# Patient Record
Sex: Female | Born: 1950 | Race: White | Hispanic: No | Smoking: Former smoker
Health system: Southern US, Community
[De-identification: ages and names within clinical notes are randomized; demographics above are authoritative.]

## PROBLEM LIST (undated history)

## (undated) DIAGNOSIS — I10 Essential (primary) hypertension: Secondary | ICD-10-CM

## (undated) DIAGNOSIS — Z780 Asymptomatic menopausal state: Secondary | ICD-10-CM

## (undated) DIAGNOSIS — E785 Hyperlipidemia, unspecified: Secondary | ICD-10-CM

## (undated) DIAGNOSIS — S83209A Unspecified tear of unspecified meniscus, current injury, unspecified knee, initial encounter: Secondary | ICD-10-CM

## (undated) HISTORY — DX: Asymptomatic menopausal state: Z78.0

## (undated) HISTORY — DX: Hyperlipidemia, unspecified: E78.5

## (undated) HISTORY — PX: TONSILLECTOMY: SHX5217

## (undated) HISTORY — DX: Unspecified tear of unspecified meniscus, current injury, unspecified knee, initial encounter: S83.209A

## (undated) HISTORY — DX: Essential (primary) hypertension: I10

---

## 1976-08-23 HISTORY — PX: BREAST ENHANCEMENT SURGERY: SHX7

## 1998-06-03 ENCOUNTER — Encounter: Payer: Self-pay | Admitting: Family Medicine

## 1998-06-03 ENCOUNTER — Ambulatory Visit (HOSPITAL_COMMUNITY): Admission: RE | Admit: 1998-06-03 | Discharge: 1998-06-03 | Payer: Self-pay | Admitting: Family Medicine

## 1998-06-18 ENCOUNTER — Ambulatory Visit (HOSPITAL_COMMUNITY): Admission: RE | Admit: 1998-06-18 | Discharge: 1998-06-18 | Payer: Self-pay | Admitting: Family Medicine

## 1998-06-18 ENCOUNTER — Encounter: Payer: Self-pay | Admitting: Family Medicine

## 2003-03-04 ENCOUNTER — Other Ambulatory Visit: Admission: RE | Admit: 2003-03-04 | Discharge: 2003-03-04 | Payer: Self-pay | Admitting: Family Medicine

## 2003-03-27 LAB — HM COLONOSCOPY: HM Colonoscopy: UNDETERMINED

## 2004-07-08 ENCOUNTER — Ambulatory Visit: Payer: Self-pay | Admitting: Family Medicine

## 2004-12-14 ENCOUNTER — Ambulatory Visit: Payer: Self-pay | Admitting: Family Medicine

## 2004-12-25 ENCOUNTER — Ambulatory Visit: Payer: Self-pay | Admitting: *Deleted

## 2005-08-04 ENCOUNTER — Ambulatory Visit: Payer: Self-pay | Admitting: Family Medicine

## 2005-08-05 ENCOUNTER — Encounter: Admission: RE | Admit: 2005-08-05 | Discharge: 2005-08-05 | Payer: Self-pay | Admitting: Family Medicine

## 2005-08-18 ENCOUNTER — Ambulatory Visit: Payer: Self-pay

## 2005-08-20 ENCOUNTER — Encounter: Admission: RE | Admit: 2005-08-20 | Discharge: 2005-08-20 | Payer: Self-pay | Admitting: Family Medicine

## 2005-08-24 ENCOUNTER — Ambulatory Visit: Payer: Self-pay | Admitting: Family Medicine

## 2006-08-22 ENCOUNTER — Ambulatory Visit: Payer: Self-pay | Admitting: Family Medicine

## 2006-09-05 ENCOUNTER — Ambulatory Visit: Payer: Self-pay | Admitting: Family Medicine

## 2006-09-05 LAB — CONVERTED CEMR LAB
Albumin: 4.2 g/dL (ref 3.5–5.2)
Alkaline Phosphatase: 65 units/L (ref 39–117)
Basophils Absolute: 0.1 10*3/uL (ref 0.0–0.1)
CO2: 31 meq/L (ref 19–32)
Calcium: 9.5 mg/dL (ref 8.4–10.5)
Chol/HDL Ratio, serum: 4.4
Creatinine, Ser: 1 mg/dL (ref 0.4–1.2)
Glomerular Filtration Rate, Af Am: 74 mL/min/{1.73_m2}
Glucose, Bld: 103 mg/dL — ABNORMAL HIGH (ref 70–99)
HDL: 46.6 mg/dL (ref >39.0)
Lymphocytes Relative: 24.5 % (ref 12.0–46.0)
MCHC: 34.1 g/dL (ref 30.0–36.0)
Monocytes Absolute: 0.4 10*3/uL (ref 0.2–0.7)
Monocytes Relative: 6.1 % (ref 3.0–11.0)
Neutro Abs: 3.9 10*3/uL (ref 1.4–7.7)
Neutrophils Relative %: 67.3 % (ref 43.0–77.0)
Platelets: 274 10*3/uL (ref 150–400)
Potassium: 3.7 meq/L (ref 3.5–5.1)
TSH: 1.04 microintl units/mL (ref 0.35–5.50)
Total Bilirubin: 0.9 mg/dL (ref 0.3–1.2)
Total Protein: 7.3 g/dL (ref 6.0–8.3)
Triglyceride fasting, serum: 126 mg/dL (ref 0–149)

## 2006-11-14 ENCOUNTER — Encounter: Payer: Self-pay | Admitting: Family Medicine

## 2006-11-14 ENCOUNTER — Ambulatory Visit: Payer: Self-pay | Admitting: Family Medicine

## 2006-11-14 ENCOUNTER — Other Ambulatory Visit: Admission: RE | Admit: 2006-11-14 | Discharge: 2006-11-14 | Payer: Self-pay | Admitting: Family Medicine

## 2007-01-26 ENCOUNTER — Encounter: Payer: Self-pay | Admitting: Family Medicine

## 2007-06-01 ENCOUNTER — Telehealth (INDEPENDENT_AMBULATORY_CARE_PROVIDER_SITE_OTHER): Payer: Self-pay | Admitting: *Deleted

## 2007-06-22 ENCOUNTER — Telehealth (INDEPENDENT_AMBULATORY_CARE_PROVIDER_SITE_OTHER): Payer: Self-pay | Admitting: *Deleted

## 2007-09-27 ENCOUNTER — Ambulatory Visit: Payer: Self-pay | Admitting: Family Medicine

## 2007-09-27 DIAGNOSIS — M722 Plantar fascial fibromatosis: Secondary | ICD-10-CM | POA: Insufficient documentation

## 2007-09-27 DIAGNOSIS — I10 Essential (primary) hypertension: Secondary | ICD-10-CM | POA: Insufficient documentation

## 2007-09-27 DIAGNOSIS — J309 Allergic rhinitis, unspecified: Secondary | ICD-10-CM | POA: Insufficient documentation

## 2007-09-27 DIAGNOSIS — N951 Menopausal and female climacteric states: Secondary | ICD-10-CM | POA: Insufficient documentation

## 2007-10-17 ENCOUNTER — Ambulatory Visit: Payer: Self-pay | Admitting: Family Medicine

## 2007-10-18 ENCOUNTER — Encounter: Payer: Self-pay | Admitting: Family Medicine

## 2007-10-30 LAB — CONVERTED CEMR LAB
ALT: 21 units/L (ref 0–35)
AST: 35 units/L (ref 0–37)
Albumin: 4 g/dL (ref 3.5–5.2)
Alkaline Phosphatase: 50 units/L (ref 39–117)
BUN: 19 mg/dL (ref 6–23)
Bilirubin, Direct: 0.1 mg/dL (ref 0.0–0.3)
Calcium: 9.5 mg/dL (ref 8.4–10.5)
Chloride: 105 meq/L (ref 96–112)
Cholesterol: 197 mg/dL (ref 0–200)
GFR calc Af Amer: 74 mL/min
GFR calc non Af Amer: 61 mL/min
LDL Cholesterol: 125 mg/dL — ABNORMAL HIGH (ref 0–99)
Total CHOL/HDL Ratio: 3.6
VLDL: 17 mg/dL (ref 0–40)

## 2008-04-01 ENCOUNTER — Ambulatory Visit: Payer: Self-pay | Admitting: Family Medicine

## 2008-04-01 ENCOUNTER — Telehealth (INDEPENDENT_AMBULATORY_CARE_PROVIDER_SITE_OTHER): Payer: Self-pay | Admitting: *Deleted

## 2008-04-01 ENCOUNTER — Encounter (INDEPENDENT_AMBULATORY_CARE_PROVIDER_SITE_OTHER): Payer: Self-pay | Admitting: *Deleted

## 2008-04-01 LAB — CONVERTED CEMR LAB
ALT: 20 units/L (ref 0–35)
AST: 35 units/L (ref 0–37)
Albumin: 3.8 g/dL (ref 3.5–5.2)
Alkaline Phosphatase: 48 units/L (ref 39–117)

## 2008-05-30 ENCOUNTER — Encounter (INDEPENDENT_AMBULATORY_CARE_PROVIDER_SITE_OTHER): Payer: Self-pay | Admitting: *Deleted

## 2008-07-30 ENCOUNTER — Other Ambulatory Visit: Admission: RE | Admit: 2008-07-30 | Discharge: 2008-07-30 | Payer: Self-pay | Admitting: Family Medicine

## 2008-07-30 ENCOUNTER — Encounter: Payer: Self-pay | Admitting: Family Medicine

## 2008-07-30 ENCOUNTER — Ambulatory Visit: Payer: Self-pay | Admitting: Family Medicine

## 2008-07-30 DIAGNOSIS — Z780 Asymptomatic menopausal state: Secondary | ICD-10-CM | POA: Insufficient documentation

## 2008-07-30 LAB — CONVERTED CEMR LAB
Blood in Urine, dipstick: NEGATIVE
Glucose, Urine, Semiquant: NEGATIVE
Nitrite: NEGATIVE
Specific Gravity, Urine: 1.01
WBC Urine, dipstick: NEGATIVE
pH: 6

## 2008-07-30 LAB — HM PAP SMEAR: HM Pap smear: NEGATIVE

## 2008-07-31 ENCOUNTER — Encounter: Payer: Self-pay | Admitting: Family Medicine

## 2008-08-06 ENCOUNTER — Encounter: Payer: Self-pay | Admitting: Family Medicine

## 2008-08-06 ENCOUNTER — Encounter (INDEPENDENT_AMBULATORY_CARE_PROVIDER_SITE_OTHER): Payer: Self-pay | Admitting: *Deleted

## 2008-08-12 ENCOUNTER — Encounter (INDEPENDENT_AMBULATORY_CARE_PROVIDER_SITE_OTHER): Payer: Self-pay | Admitting: *Deleted

## 2008-08-12 LAB — CONVERTED CEMR LAB
ALT: 22 units/L (ref 0–35)
AST: 44 units/L — ABNORMAL HIGH (ref 0–37)
Albumin: 4.1 g/dL (ref 3.5–5.2)
BUN: 16 mg/dL (ref 6–23)
Basophils Relative: 2.8 % (ref 0.0–3.0)
CO2: 28 meq/L (ref 19–32)
Calcium: 9.4 mg/dL (ref 8.4–10.5)
Chloride: 105 meq/L (ref 96–112)
Creatinine, Ser: 1 mg/dL (ref 0.4–1.2)
Direct LDL: 130.2 mg/dL
Eosinophils Relative: 3.5 % (ref 0.0–5.0)
Glucose, Bld: 55 mg/dL — ABNORMAL LOW (ref 70–99)
HDL: 50.3 mg/dL (ref >39.0)
Hemoglobin: 13.3 g/dL (ref 12.0–15.0)
Lymphocytes Relative: 40.9 % (ref 12.0–46.0)
MCHC: 33.6 g/dL (ref 30.0–36.0)
MCV: 85.3 fL (ref 78.0–100.0)
Neutro Abs: 1.6 10*3/uL (ref 1.4–7.7)
Neutrophils Relative %: 31 % — ABNORMAL LOW (ref 43.0–77.0)
RBC: 4.63 M/uL (ref 3.87–5.11)
TSH: 1.59 microintl units/mL (ref 0.35–5.50)
Total Protein: 7.2 g/dL (ref 6.0–8.3)
VLDL: 28 mg/dL (ref 0–40)
WBC: 5.2 10*3/uL (ref 4.5–10.5)

## 2008-09-02 ENCOUNTER — Encounter (INDEPENDENT_AMBULATORY_CARE_PROVIDER_SITE_OTHER): Payer: Self-pay | Admitting: *Deleted

## 2008-09-02 ENCOUNTER — Ambulatory Visit: Payer: Self-pay | Admitting: Family Medicine

## 2008-09-02 LAB — CONVERTED CEMR LAB: OCCULT 3: NEGATIVE

## 2008-09-03 ENCOUNTER — Encounter: Payer: Self-pay | Admitting: Family Medicine

## 2008-09-13 ENCOUNTER — Encounter (INDEPENDENT_AMBULATORY_CARE_PROVIDER_SITE_OTHER): Payer: Self-pay | Admitting: *Deleted

## 2008-11-01 ENCOUNTER — Ambulatory Visit: Payer: Self-pay | Admitting: Family Medicine

## 2008-11-01 DIAGNOSIS — R059 Cough, unspecified: Secondary | ICD-10-CM | POA: Insufficient documentation

## 2008-11-01 DIAGNOSIS — R05 Cough: Secondary | ICD-10-CM

## 2008-11-27 ENCOUNTER — Ambulatory Visit: Payer: Self-pay | Admitting: Family Medicine

## 2008-12-02 ENCOUNTER — Telehealth (INDEPENDENT_AMBULATORY_CARE_PROVIDER_SITE_OTHER): Payer: Self-pay | Admitting: *Deleted

## 2009-03-13 ENCOUNTER — Ambulatory Visit: Payer: Self-pay | Admitting: Family Medicine

## 2009-04-29 ENCOUNTER — Encounter (INDEPENDENT_AMBULATORY_CARE_PROVIDER_SITE_OTHER): Payer: Self-pay | Admitting: *Deleted

## 2009-06-09 ENCOUNTER — Ambulatory Visit: Payer: Self-pay | Admitting: Family Medicine

## 2009-06-09 DIAGNOSIS — M79609 Pain in unspecified limb: Secondary | ICD-10-CM | POA: Insufficient documentation

## 2009-07-09 ENCOUNTER — Telehealth: Payer: Self-pay | Admitting: Family Medicine

## 2009-07-24 ENCOUNTER — Ambulatory Visit: Payer: Self-pay | Admitting: Family Medicine

## 2009-07-28 LAB — CONVERTED CEMR LAB
AST: 38 units/L — ABNORMAL HIGH (ref 0–37)
BUN: 13 mg/dL (ref 6–23)
Bilirubin, Direct: 0 mg/dL (ref 0.0–0.3)
Direct LDL: 157.6 mg/dL
GFR calc non Af Amer: 68.32 mL/min (ref >60)
HDL: 45 mg/dL (ref >39.00)
Potassium: 3.8 meq/L (ref 3.5–5.1)
Sodium: 141 meq/L (ref 135–145)
Total Bilirubin: 0.9 mg/dL (ref 0.3–1.2)
Total CHOL/HDL Ratio: 5
VLDL: 23.6 mg/dL (ref 0.0–40.0)

## 2009-09-09 ENCOUNTER — Ambulatory Visit: Payer: Self-pay | Admitting: Family Medicine

## 2009-09-09 DIAGNOSIS — E785 Hyperlipidemia, unspecified: Secondary | ICD-10-CM | POA: Insufficient documentation

## 2009-10-01 ENCOUNTER — Ambulatory Visit: Payer: Self-pay | Admitting: Family Medicine

## 2009-11-20 ENCOUNTER — Ambulatory Visit: Payer: Self-pay | Admitting: Family Medicine

## 2009-11-20 DIAGNOSIS — J019 Acute sinusitis, unspecified: Secondary | ICD-10-CM | POA: Insufficient documentation

## 2009-12-01 ENCOUNTER — Telehealth (INDEPENDENT_AMBULATORY_CARE_PROVIDER_SITE_OTHER): Payer: Self-pay | Admitting: *Deleted

## 2009-12-09 ENCOUNTER — Ambulatory Visit: Payer: Self-pay | Admitting: Family Medicine

## 2009-12-09 LAB — CONVERTED CEMR LAB
ALT: 29 units/L (ref 0–35)
AST: 43 units/L — ABNORMAL HIGH (ref 0–37)
Albumin: 4.1 g/dL (ref 3.5–5.2)
Alkaline Phosphatase: 56 units/L (ref 39–117)

## 2009-12-23 ENCOUNTER — Telehealth (INDEPENDENT_AMBULATORY_CARE_PROVIDER_SITE_OTHER): Payer: Self-pay | Admitting: *Deleted

## 2009-12-31 ENCOUNTER — Telehealth (INDEPENDENT_AMBULATORY_CARE_PROVIDER_SITE_OTHER): Payer: Self-pay | Admitting: *Deleted

## 2010-01-20 ENCOUNTER — Telehealth: Payer: Self-pay | Admitting: Family Medicine

## 2010-02-26 ENCOUNTER — Ambulatory Visit: Payer: Self-pay | Admitting: Family Medicine

## 2010-03-13 ENCOUNTER — Ambulatory Visit: Payer: Self-pay | Admitting: Family Medicine

## 2010-08-26 ENCOUNTER — Other Ambulatory Visit: Payer: Self-pay | Admitting: Family Medicine

## 2010-08-26 ENCOUNTER — Telehealth (INDEPENDENT_AMBULATORY_CARE_PROVIDER_SITE_OTHER): Payer: Self-pay | Admitting: *Deleted

## 2010-08-26 ENCOUNTER — Ambulatory Visit
Admission: RE | Admit: 2010-08-26 | Discharge: 2010-08-26 | Payer: Self-pay | Source: Home / Self Care | Attending: Family Medicine | Admitting: Family Medicine

## 2010-08-26 LAB — HEPATIC FUNCTION PANEL
ALT: 38 U/L — ABNORMAL HIGH (ref 0–35)
AST: 51 U/L — ABNORMAL HIGH (ref 0–37)
Albumin: 4 g/dL (ref 3.5–5.2)
Alkaline Phosphatase: 55 U/L (ref 39–117)
Bilirubin, Direct: 0.1 mg/dL (ref 0.0–0.3)
Total Bilirubin: 0.9 mg/dL (ref 0.3–1.2)
Total Protein: 7 g/dL (ref 6.0–8.3)

## 2010-08-26 LAB — LIPID PANEL
Cholesterol: 156 mg/dL (ref 0–200)
HDL: 52.7 mg/dL (ref >39.00)
LDL Cholesterol: 84 mg/dL (ref 0–99)
Total CHOL/HDL Ratio: 3
Triglycerides: 98 mg/dL (ref 0.0–149.0)
VLDL: 19.6 mg/dL (ref 0.0–40.0)

## 2010-08-26 LAB — BASIC METABOLIC PANEL
BUN: 17 mg/dL (ref 6–23)
CO2: 29 mEq/L (ref 19–32)
Calcium: 9.1 mg/dL (ref 8.4–10.5)
Chloride: 104 mEq/L (ref 96–112)
Creatinine, Ser: 0.8 mg/dL (ref 0.4–1.2)
GFR: 82.73 mL/min (ref >60.00)
Glucose, Bld: 92 mg/dL (ref 70–99)
Potassium: 3.9 mEq/L (ref 3.5–5.1)
Sodium: 139 mEq/L (ref 135–145)

## 2010-09-09 ENCOUNTER — Ambulatory Visit
Admission: RE | Admit: 2010-09-09 | Discharge: 2010-09-09 | Payer: Self-pay | Source: Home / Self Care | Attending: Family Medicine | Admitting: Family Medicine

## 2010-09-09 ENCOUNTER — Other Ambulatory Visit: Payer: Self-pay | Admitting: Family Medicine

## 2010-09-09 ENCOUNTER — Encounter: Payer: Self-pay | Admitting: Family Medicine

## 2010-09-09 LAB — HEPATIC FUNCTION PANEL
ALT: 31 U/L (ref 0–35)
AST: 46 U/L — ABNORMAL HIGH (ref 0–37)
Albumin: 4.1 g/dL (ref 3.5–5.2)
Alkaline Phosphatase: 60 U/L (ref 39–117)
Bilirubin, Direct: 0.1 mg/dL (ref 0.0–0.3)
Total Bilirubin: 0.8 mg/dL (ref 0.3–1.2)
Total Protein: 6.8 g/dL (ref 6.0–8.3)

## 2010-09-09 LAB — GAMMA GT: GGT: 18 U/L (ref 7–51)

## 2010-09-11 LAB — CONVERTED CEMR LAB
Hep B C IgM: NEGATIVE
Hepatitis B Surface Ag: NEGATIVE

## 2010-09-14 ENCOUNTER — Encounter: Payer: Self-pay | Admitting: Family Medicine

## 2010-09-14 ENCOUNTER — Telehealth: Payer: Self-pay | Admitting: Family Medicine

## 2010-09-21 ENCOUNTER — Encounter: Payer: Self-pay | Admitting: Family Medicine

## 2010-09-22 NOTE — Assessment & Plan Note (Signed)
Summary: rto 2 weeks.cbs   Vital Signs:  Patient profile:   60 year old female Height:      64 inches Weight:      187 pounds Temp:     98.4 degrees F oral Pulse rate:   72 / minute BP sitting:   140 / 100  (left arm)  Vitals Entered By: Jeremy Johann CMA (March 13, 2010 9:47 AM)  Serial Vital Signs/Assessments:  Time      Position  BP       Pulse  Resp  Temp     By 10:12 AM            120/86                         Loreen Freud DO  CC: 2 week f/u BP    History of Present Illness: pt here for bp check since changing med.  No complaints.  Current Medications (verified): 1)  Ziac 5-6.25 Mg Tabs (Bisoprolol-Hydrochlorothiazide) .Marland Kitchen.. 1 By Mouth Once Daily 2)  Nasonex 50 Mcg/act Susp (Mometasone Furoate) .... 2 Sprays Each Nostril Once Daily 3)  Lipitor 10 Mg Tabs (Atorvastatin Calcium) .Marland Kitchen.. 1 By Mouth At Bedtime 4)  Zyrtec Allergy 10 Mg Tabs (Cetirizine Hcl) .... Take 1 Tab As Needed  Allergies (verified): 1)  ! Sulfa  Past History:  Past medical, surgical, family and social histories (including risk factors) reviewed for relevance to current acute and chronic problems.  Past Medical History: Reviewed history from 09/09/2009 and no changes required. Hypertension Allergic rhinitis Hyperlipidemia  Past Surgical History: Reviewed history from 09/27/2007 and no changes required. Tonsillectomy Breast implants-1997  Family History: Reviewed history from 07/30/2008 and no changes required. Family History Diabetes 1st degree relative Family History Hypertension Family History Ovarian cancer MGM--cva  Social History: Reviewed history from 07/30/2008 and no changes required. Occupation: Advice worker Married Never Smoked Alcohol use-yes Drug use-no Regular exercise-yes  Review of Systems      See HPI  Physical Exam  General:  Well-developed,well-nourished,in no acute distress; alert,appropriate and cooperative throughout examination Lungs:  Normal  respiratory effort, chest expands symmetrically. Lungs are clear to auscultation, no crackles or wheezes. Heart:  normal rate and no murmur.   Extremities:  No clubbing, cyanosis, edema, or deformity noted with normal full range of motion of all joints.   Psych:  Cognition and judgment appear intact. Alert and cooperative with normal attention span and concentration. No apparent delusions, illusions, hallucinations   Impression & Recommendations:  Problem # 1:  HYPERTENSION (ICD-401.9)  Her updated medication list for this problem includes:    Ziac 5-6.25 Mg Tabs (Bisoprolol-hydrochlorothiazide) .Marland Kitchen... 1 by mouth once daily  BP today: 140/100 Prior BP: 130/78 (02/26/2010)  Prior 10 Yr Risk Heart Disease: Not enough information (09/27/2007)  Labs Reviewed: K+: 3.8 (07/24/2009) Creat: : 0.9 (07/24/2009)   Chol: 209 (07/24/2009)   HDL: 45.00 (07/24/2009)   LDL: DEL (07/30/2008)   TG: 118.0 (07/24/2009)  Problem # 2:  HYPERLIPIDEMIA (ICD-272.4)  Her updated medication list for this problem includes:    Lipitor 10 Mg Tabs (Atorvastatin calcium) .Marland Kitchen... 1 by mouth at bedtime  Labs Reviewed: SGOT: 43 (12/09/2009)   SGPT: 29 (12/09/2009)  Prior 10 Yr Risk Heart Disease: Not enough information (09/27/2007)   HDL:45.00 (07/24/2009), 50.3 (07/30/2008)  LDL:DEL (07/30/2008), 125 (10/17/2007)  Chol:209 (07/24/2009), 205 (07/30/2008)  Trig:118.0 (07/24/2009), 140 (07/30/2008)  Complete Medication List: 1)  Ziac 5-6.25 Mg Tabs (Bisoprolol-hydrochlorothiazide) .Marland KitchenMarland KitchenMarland Kitchen  1 by mouth once daily 2)  Nasonex 50 Mcg/act Susp (Mometasone furoate) .... 2 sprays each nostril once daily 3)  Lipitor 10 Mg Tabs (Atorvastatin calcium) .Marland Kitchen.. 1 by mouth at bedtime 4)  Zyrtec Allergy 10 Mg Tabs (Cetirizine hcl) .... Take 1 tab as needed  Patient Instructions: 1)  f/u October for labs and bp Prescriptions: ZIAC 5-6.25 MG TABS (BISOPROLOL-HYDROCHLOROTHIAZIDE) 1 by mouth once daily  #90 x 3   Entered and Authorized  by:   Loreen Freud DO   Signed by:   Loreen Freud DO on 03/13/2010   Method used:   Electronically to        [id number] (retail)       [address]       [place], [place]  [address]       Ph: [phone]       Fax: [phone]   RxID:   [id number]

## 2010-09-22 NOTE — Assessment & Plan Note (Signed)
Summary: COUGH,FLUID IN EARS/KDC   Vital Signs:  Patient profile:   60 year old female Weight:      182 pounds Temp:     98.3 degrees F oral Pulse rate:   78 / minute Pulse rhythm:   regular BP sitting:   140 / 82  (left arm) Cuff size:   large  Vitals Entered By: Army Fossa CMA (November 20, 2009 12:52 PM) CC: Pt here c/o chest congestion, feels like she has fluid in her ears. x 1 week., URI symptoms   History of Present Illness:       This is a 60 year old woman who presents with URI symptoms.  The symptoms began 1 week ago.  The patient complains of nasal congestion, purulent nasal discharge, productive cough, and earache, but denies sick contacts.  The patient denies fever, low-grade fever (<100.5 degrees), fever of 100.5-103 degrees, fever of 103.1-104 degrees, fever to >104 degrees, stiff neck, dyspnea, wheezing, rash, vomiting, diarrhea, use of an antipyretic, and response to antipyretic.  The patient also reports headache.  The patient denies itchy watery eyes, itchy throat, sneezing, seasonal symptoms, response to antihistamine, muscle aches, and severe fatigue.  The patient denies the following risk factors for Strep sinusitis: unilateral facial pain, unilateral nasal discharge, poor response to decongestant, double sickening, tooth pain, Strep exposure, tender adenopathy, and absence of cough.  B/L sinus pressure.  Pt has nasonex but is not using it.  Current Medications (verified): 1)  Norvasc 10 Mg Tabs (Amlodipine Besylate) .Marland Kitchen.. 1 By Mouth Once Daily 2)  Nasonex 50 Mcg/act Susp (Mometasone Furoate) .... 2 Sprays Each Nostril Once Daily 3)  Clonazepam 0.5 Mg Tabs (Clonazepam) .... Take 1/2 Day As Needed 4)  Augmentin 875-125 Mg Tabs (Amoxicillin-Pot Clavulanate) .Marland Kitchen.. 1 By Mouth Two Times A Day  Allergies: 1)  ! Sulfa  Past History:  Past medical, surgical, family and social histories (including risk factors) reviewed for relevance to current acute and chronic  problems.  Past Medical History: Reviewed history from 09/09/2009 and no changes required. Hypertension Allergic rhinitis Hyperlipidemia  Past Surgical History: Reviewed history from 09/27/2007 and no changes required. Tonsillectomy Breast implants-1997  Family History: Reviewed history from 07/30/2008 and no changes required. Family History Diabetes 1st degree relative Family History Hypertension Family History Ovarian cancer MGM--cva  Social History: Reviewed history from 07/30/2008 and no changes required. 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   Nose:  no external deformity, L frontal sinus tenderness, L maxillary sinus tenderness, R frontal sinus tenderness, and R maxillary sinus tenderness.   Mouth:  Oral mucosa and oropharynx without lesions or exudates.  Teeth in good repair. Neck:  No deformities, masses, or tenderness noted. Lungs:  Normal respiratory effort, chest expands symmetrically. Lungs are clear to auscultation, no crackles or wheezes. Heart:  normal rate and no murmur.   Skin:  Intact without suspicious lesions or rashes Cervical Nodes:  No lymphadenopathy noted Psych:  Cognition and judgment appear intact. Alert and cooperative with normal attention span and concentration. No apparent delusions, illusions, hallucinations   Impression & Recommendations:  Problem # 1:  SINUSITIS - ACUTE-NOS (ICD-461.9)  Her updated medication list for this problem includes:    Nasonex 50 Mcg/act Susp (Mometasone furoate) .Marland KitchenMarland KitchenMarland KitchenMarland Kitchen 2  sprays each nostril once daily    Augmentin 875-125 Mg Tabs (Amoxicillin-pot clavulanate) .Marland Kitchen... 1 by mouth two times a day  Instructed on treatment. Call if  symptoms persist or worsen.   Complete Medication List: 1)  Norvasc 10 Mg Tabs (Amlodipine besylate) .Marland Kitchen.. 1 by mouth once daily 2)  Nasonex 50 Mcg/act Susp (Mometasone furoate) .... 2 sprays each nostril once daily 3)  Clonazepam 0.5 Mg Tabs (Clonazepam) .... Take 1/2 day as needed 4)  Augmentin 875-125 Mg Tabs (Amoxicillin-pot clavulanate) .Marland Kitchen.. 1 by mouth two times a day Prescriptions: AUGMENTIN 875-125 MG TABS (AMOXICILLIN-POT CLAVULANATE) 1 by mouth two times a day  #20 x 0   Entered and Authorized by:   Loreen Freud DO   Signed by:   Loreen Freud DO on 11/20/2009   Method used:   Electronically to        [id number] (retail)       [address]       [place], [place]  [address]       Ph: [phone]       Fax: [phone]   RxID:   [id number]

## 2010-09-22 NOTE — Assessment & Plan Note (Signed)
Summary: rto 3  months/cbs   Vital Signs:  Patient profile:   60 year old female Height:      64 inches Weight:      184 pounds Temp:     98.2 degrees F oral Pulse rate:   66 / minute BP sitting:   130 / 78  (left arm)  Vitals Entered By: Jeremy Johann CMA (February 26, 2010 9:35 AM) CC: 3 month f/u Comments REVIEWED MED LIST, PATIENT AGREED DOSE AND INSTRUCTION CORRECT    History of Present Illness:  Hypertension follow-up      This is a 60 year old woman who presents for Hypertension follow-up.  Pt only complaint is that exforge is too expensive.  The patient denies lightheadedness, urinary frequency, headaches, edema, impotence, rash, and fatigue.  The patient denies the following associated symptoms: chest pain, chest pressure, exercise intolerance, dyspnea, palpitations, syncope, leg edema, and pedal edema.  Compliance with medications (by patient report) has been near 100%.  The patient reports that dietary compliance has been good.  The patient reports exercising daily.  Adjunctive measures currently used by the patient include salt restriction.    Hyperlipidemia follow-up      The patient also presents for Hyperlipidemia follow-up.  The patient complains of fatigue, but denies muscle aches, GI upset, abdominal pain, flushing, itching, constipation, and diarrhea.  The patient denies the following symptoms: chest pain/pressure, exercise intolerance, dypsnea, palpitations, syncope, and pedal edema.  Compliance with medications (by patient report) has been near 100%.  Dietary compliance has been good.  The patient reports exercising daily.    Allergies: 1)  ! Sulfa  Past History:  Past medical, surgical, family and social histories (including risk factors) reviewed for relevance to current acute and chronic problems.  Past Medical History: Reviewed history from 09/09/2009 and no changes required. Hypertension Allergic rhinitis Hyperlipidemia  Past Surgical History: Reviewed  history from 09/27/2007 and no changes required. Tonsillectomy Breast implants-1997  Family History: Reviewed history from 07/30/2008 and no changes required. Family History Diabetes 1st degree relative Family History Hypertension Family History Ovarian cancer MGM--cva  Social History: Reviewed history from 07/30/2008 and no changes required. 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 Lungs are clear to auscultation, no crackles or wheezes. Heart:  normal rate and no murmur.   Extremities:  No clubbing, cyanosis, edema, or deformity noted with normal full range of motion of all joints.   Psych:  Oriented X3 and normally interactive.     Impression & Recommendations:  Problem # 1:  HYPERTENSION (ICD-401.9)  Her updated medication list for this problem includes:    Ziac 5-6.25 Mg Tabs (Bisoprolol-hydrochlorothiazide) .Marland Kitchen... 1 by mouth once daily  BP today: 130/78 Prior BP: 140/82 (11/20/2009)  Prior 10 Yr Risk Heart Disease: Not enough information (09/27/2007)  Labs Reviewed: K+: 3.8 (07/24/2009) Creat: : 0.9 (07/24/2009)   Chol: 209 (07/24/2009)   HDL: 45.00 (07/24/2009)   LDL: DEL (07/30/2008)   TG: 118.0 (07/24/2009)  Problem # 2:  HYPERLIPIDEMIA (ICD-272.4)  Her updated medication list for this problem includes:    Lipitor 10 Mg Tabs (Atorvastatin calcium) .Marland Kitchen... 1 by mouth at bedtime  Labs Reviewed: SGOT: 43 (12/09/2009)   SGPT: 29 (12/09/2009)  Prior 10 Yr Risk Heart Disease: Not enough  information (09/27/2007)   HDL:45.00 (07/24/2009), 50.3 (07/30/2008)  LDL:DEL (07/30/2008), 125 (10/17/2007)  Chol:209 (07/24/2009), 205 (07/30/2008)  Trig:118.0 (07/24/2009), 140  (07/30/2008)  Complete Medication List: 1)  Ziac 5-6.25 Mg Tabs (Bisoprolol-hydrochlorothiazide) .Marland Kitchen.. 1 by mouth once daily 2)  Nasonex 50 Mcg/act Susp (Mometasone furoate) .... 2 sprays each nostril once daily 3)  Lipitor 10 Mg Tabs (Atorvastatin calcium) .Marland Kitchen.. 1 by mouth at bedtime 4)  Zyrtec Allergy 10 Mg Tabs (Cetirizine hcl) .... Take 1 tab as needed  Patient Instructions: 1)  Please schedule a follow-up appointment in 2 weeks.  2)  fasting labs in october-- 272.4  hep, lipid, bmp Prescriptions: LIPITOR 10 MG TABS (ATORVASTATIN CALCIUM) 1 by mouth at bedtime  #30 x 2   Entered and Authorized by:   Loreen Freud DO   Signed by:   Loreen Freud DO on 02/26/2010   Method used:   Print then Give to Patient   RxID:   [id number] LIPITOR 10 MG TABS (ATORVASTATIN CALCIUM) 1 by mouth at bedtime  #30 x 2   Entered and Authorized by:   Loreen Freud DO   Signed by:   Loreen Freud DO on 02/26/2010   Method used:   Electronically to        [id number] (retail)       [address]       [place], [place]  [address]       Ph: [phone]       Fax: [phone]   RxID:   [id number] ZIAC 5-6.25 MG TABS (BISOPROLOL-HYDROCHLOROTHIAZIDE) 1 by mouth once daily  #30 x 1   Entered and Authorized by:   Loreen Freud DO   Signed by:   Loreen Freud DO on 02/26/2010   Method used:   Electronically to        [id number] (retail)       [address]       [place], [place]  [address]       Ph: [phone]       Fax: [phone]   RxID:   [id number]

## 2010-09-22 NOTE — Progress Notes (Signed)
Summary: med change  Phone Note Call from Patient Call back at [phone]   Caller: Patient Summary of Call: Pt called and stated that since she started taking the Simvastatin 20 mg, she is very tired in the afternoons. She feels it is due to the medication, states she is taking the med at bedtime. Would like to know if you would like to see her or change the medication. Please advise. Army Fossa CMA  Jan 20, 2010 10:29 AM   Follow-up for Phone Call        try breaking in half and see if symptoms resolve--- give it 10-14 days if no better call and we will change it. Follow-up by: Loreen Freud DO,  Jan 20, 2010 12:03 PM  Additional Follow-up for Phone Call Additional follow up Details #1::        pt aware..............Marland KitchenFelecia Deloach CMA  Jan 20, 2010 12:07 PM     New/Updated Medications: ZOCOR 20 MG TABS (SIMVASTATIN) 1/2  by mouth at bedtime.

## 2010-09-22 NOTE — Progress Notes (Signed)
Summary: NEED OTHER LAB TESTS?--updated  Phone Note Call from Patient Call back at [phone]   Caller: Patient Summary of Call: PATIENT CALLED TO MAKE LAB APPOINTMENT--IT IS FOR TUESDAY 4/19 FOR LIPO PROFILE 272.4 ONLY---PLEASE VERIFY THAT THIS IS THE CORRECT LAB TEST AND TELL ME IF ANY MORE LABS NEED TO BE ADDED TO THIS FASTING LAB Initial call taken by: Jerolyn Shin,  December 01, 2009 10:46 AM  Follow-up for Phone Call        order is on last labs----272.4  nmr, hep Follow-up by: Loreen Freud DO,  December 01, 2009 1:17 PM  Additional Follow-up for Phone Call Additional follow up Details #1::        Lab orders for 4/19 have been updated with this new information Additional Follow-up by: Jerolyn Shin,  December 01, 2009 4:55 PM

## 2010-09-22 NOTE — Progress Notes (Signed)
Summary: Lab Results   Phone Note Outgoing Call   Call placed by: Army Fossa CMA,  Dec 23, 2009 9:16 AM Summary of Call: Regarding lab results, LMTCB:  LDL particle #  high---- increased risk heart attack and stroke-------  start zocor 20 mg  #30  1 by mouth at bedtime, 2 refills---recheck 3 months----272.4  hep, lipid Signed by Loreen Freud DO on 12/22/2009 at 8:56 PM  Follow-up for Phone Call        Spoke with patient and relayed blood work results.  [facility] is [facility] [address] Follow-up by: Harold Barban,  Dec 23, 2009 11:12 AM    New/Updated Medications: ZOCOR 20 MG TABS (SIMVASTATIN) 1 by mouth at bedtime. Prescriptions: ZOCOR 20 MG TABS (SIMVASTATIN) 1 by mouth at bedtime.  #30 x 2   Entered by:   Army Fossa CMA   Authorized by:   Loreen Freud DO   Signed by:   Army Fossa CMA on 12/23/2009   Method used:   Electronically to        [facility] [id number] (retail)       [address]       [place], [place]  [address]       Ph: [phone]       Fax: [phone]   RxID:   [id number]

## 2010-09-22 NOTE — Progress Notes (Signed)
Summary: -change bp med  Phone Note Call from Patient Call back at Work Phone [phone] Call back at [phone]   Caller: Patient Summary of Call: patient started taking amlodipine 10mg  several months ago -it causes her leg to swell - which is becoming bothersome -she wants  to know if bp med can be changed - [facility] [address]  Initial call taken by: Okey Regal [place],  Dec 31, 2009 9:34 AM  Follow-up for Phone Call        PLS ADVISE................Marland KitchenFelecia Deloach CMA  Dec 31, 2009 10:16 AM   Additional Follow-up for Phone Call Additional follow up Details #1::        did it only occur when med increased to 10 mg ?  If yes we will decrease to 5 mg and combine with diovan 160mg  ----  exforge 5/ 160 1 by mouth once daily---can given samples Additional Follow-up by: Loreen Freud DO,  Dec 31, 2009 10:39 AM    Additional Follow-up for Phone Call Additional follow up Details #2::    left message to call office...............Marland KitchenFelecia Deloach CMA  Dec 31, 2009 10:59 AM  pt aware samples placed up front......Marland KitchenFelecia Deloach CMA  Dec 31, 2009 12:17 PM   New/Updated Medications: EXFORGE 5-160 MG TABS (AMLODIPINE BESYLATE-VALSARTAN) Take 1 by mouth once daily Prescriptions: EXFORGE 5-160 MG TABS (AMLODIPINE BESYLATE-VALSARTAN) Take 1 by mouth once daily  #28 x 0   Entered and Authorized by:   Jeremy Johann CMA   Signed by:   Jeremy Johann CMA on 12/31/2009   Method used:   Samples Given   RxID:   [id number]

## 2010-09-22 NOTE — Assessment & Plan Note (Signed)
Summary: 3 mth fu/ns/kdc   Vital Signs:  Patient profile:   60 year old female Weight:      190 pounds BMI:     32.73 Temp:     98.4 degrees F oral Pulse rate:   68 / minute BP sitting:   150 / 90  (left arm)  Vitals Entered By: Jeremy Johann CMA (September 09, 2009 3:20 PM) CC: 3 month f/u Comments REVIEWED MED LIST, PATIENT AGREED DOSE AND INSTRUCTION CORRECT    History of Present Illness:  Hypertension follow-up      This is a 60 year old woman who presents for Hypertension follow-up.  The patient denies lightheadedness, urinary frequency, headaches, edema, impotence, rash, and fatigue.  The patient denies the following associated symptoms: chest pain, chest pressure, exercise intolerance, dyspnea, palpitations, syncope, leg edema, and pedal edema.  Compliance with medications (by patient report) has been near 100%.  The patient reports that dietary compliance has been fair.  The patient reports no exercise.  Adjunctive measures currently used by the patient include salt restriction.    Current Medications (verified): 1)  Toprol Xl 100 Mg Xr24h-Tab (Metoprolol Succinate) .Marland Kitchen.. 1 By Mouth Once Daily 2)  Nasonex 50 Mcg/act Susp (Mometasone Furoate) .... 2 Sprays Each Nostril Once Daily 3)  Clonazepam 0.5 Mg Tabs (Clonazepam) .... Take 1/2 Day As Needed 4)  Diclofenac Potassium 50 Mg Tabs (Diclofenac Potassium) .... Take 1 To 2 Per Week As Needed  Allergies: 1)  ! Sulfa  Past History:  Past medical, surgical, family and social histories (including risk factors) reviewed for relevance to current acute and chronic problems.  Past Medical History: Hypertension Allergic rhinitis Hyperlipidemia  Past Surgical History: Reviewed history from 09/27/2007 and no changes required. Tonsillectomy Breast implants-1997  Family History: Reviewed history from 07/30/2008 and no changes required. Family History Diabetes 1st degree relative Family History Hypertension Family History  Ovarian cancer MGM--cva  Social History: Reviewed history from 07/30/2008 and no changes required. 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 Lungs:  Normal respiratory effort, chest expands symmetrically. Lungs are clear to auscultation, no crackles or wheezes. Heart:  Normal rate and regular rhythm. S1 and S2 normal without gallop, murmur, click, rub or other extra sounds. Extremities:  No clubbing, cyanosis, edema, or deformity noted with normal full range of motion of all joints.   Psych:  Oriented X3 and normally interactive.     Impression & Recommendations:  Problem # 1:  HYPERTENSION (ICD-401.9)  Her updated medication list for this problem includes:    Norvasc 5 Mg Tabs (Amlodipine besylate) .Marland Kitchen... 1 by mouth once daily  BP today: 150/90 Prior BP: 142/84 (06/09/2009)  Prior 10 Yr Risk Heart Disease: Not enough information (09/27/2007)  Labs Reviewed: K+: 3.8 (07/24/2009) Creat: : 0.9 (07/24/2009)   Chol: 209 (07/24/2009)   HDL: 45.00 (07/24/2009)   LDL: DEL (07/30/2008)   TG: 118.0 (07/24/2009)  Problem # 2:  HYPERLIPIDEMIA (ICD-272.4)  repeat labs 3-6 months  Labs Reviewed: SGOT: 38 (07/24/2009)   SGPT: 28 (07/24/2009)  Prior 10 Yr Risk Heart Disease: Not enough information (09/27/2007)   HDL:45.00 (07/24/2009), 50.3 (07/30/2008)  LDL:DEL (07/30/2008), 125 (10/17/2007)  Chol:209 (07/24/2009), 205 (07/30/2008)  Trig:118.0 (07/24/2009), 140 (07/30/2008)  Complete Medication List: 1)  Norvasc 5 Mg  Tabs (Amlodipine besylate) .Marland Kitchen.. 1 by mouth once daily 2)  Nasonex 50 Mcg/act Susp (Mometasone furoate) .... 2 sprays each nostril once daily 3)  Clonazepam 0.5 Mg Tabs (Clonazepam) .... Take 1/2  day as needed 4)  Diclofenac Potassium 50 Mg Tabs (Diclofenac potassium) .... Take 1 to 2 per week as needed  Patient Instructions: 1)  rto 2-3 weeks Prescriptions: NORVASC 5 MG TABS (AMLODIPINE BESYLATE) 1 by mouth once daily  #30 x 2   Entered and Authorized by:   Loreen Freud DO   Signed by:   Loreen Freud DO on 09/09/2009   Method used:   Electronically to        [id number] (retail)       [address]       [place], [place]  [address]       Ph: [phone]       Fax: [phone]   RxID:   [id number]

## 2010-09-22 NOTE — Assessment & Plan Note (Signed)
Summary: BP CHECK//PH   Vital Signs:  Patient profile:   60 year old female Weight:      186.13 pounds Temp:     98.1 degrees F oral Pulse rate:   72 / minute Pulse rhythm:   regular BP sitting:   148 / 86  (left arm) Cuff size:   large  Vitals Entered By: Army Fossa CMA (October 01, 2009 10:37 AM) CC: bp check   History of Present Illness:  Hypertension follow-up      This is a 60 year old woman who presents for Hypertension follow-up.  The patient denies lightheadedness, urinary frequency, headaches, edema, impotence, rash, and fatigue.  The patient denies the following associated symptoms: chest pain, chest pressure, exercise intolerance, dyspnea, palpitations, syncope, leg edema, and pedal edema.  Compliance with medications (by patient report) has been near 100%.  The patient reports that dietary compliance has been good.  Adjunctive measures currently used by the patient include salt restriction.    Current Medications (verified): 1)  Norvasc 10 Mg Tabs (Amlodipine Besylate) .Marland Kitchen.. 1 By Mouth Once Daily 2)  Nasonex 50 Mcg/act Susp (Mometasone Furoate) .... 2 Sprays Each Nostril Once Daily 3)  Clonazepam 0.5 Mg Tabs (Clonazepam) .... Take 1/2 Day As Needed  Allergies: 1)  ! Sulfa  Past History:  Past medical, surgical, family and social histories (including risk factors) reviewed for relevance to current acute and chronic problems.  Past Medical History: Reviewed history from 09/09/2009 and no changes required. Hypertension Allergic rhinitis Hyperlipidemia  Past Surgical History: Reviewed history from 09/27/2007 and no changes required. Tonsillectomy Breast implants-1997  Family History: Reviewed history from 07/30/2008 and no changes required. Family History Diabetes 1st degree relative Family History Hypertension Family History Ovarian cancer MGM--cva  Social History: Reviewed history from 07/30/2008 and no changes required. Occupation: Psychiatrist Married Never Smoked Alcohol use-yes Drug use-no Regular exercise-yes  Review of Systems      See HPI  Physical Exam  General:  Well-developed,well-nourished,in no acute distress; alert,appropriate and cooperative throughout examination Lungs:  Normal respiratory effort, chest expands symmetrically. Lungs are clear to auscultation, no crackles or wheezes. Heart:  Normal rate and regular rhythm. S1 and S2 normal without gallop, murmur, click, rub or other extra sounds. Extremities:  No clubbing, cyanosis, edema, or deformity noted with normal full range of motion of all joints.   Psych:  Cognition and judgment appear intact. Alert and cooperative with normal attention span and concentration. No apparent delusions, illusions, hallucinations   Impression & Recommendations:  Problem # 1:  HYPERTENSION (ICD-401.9)  Labs Reviewed: SGOT: 38 (07/24/2009)   SGPT: 28 (07/24/2009)  Prior 10 Yr Risk Heart Disease: Not enough information (09/27/2007)   HDL:45.00 (07/24/2009), 50.3 (07/30/2008)  LDL:DEL (07/30/2008), 125 (10/17/2007)  Chol:209 (07/24/2009), 205 (07/30/2008)  Trig:118.0 (07/24/2009), 140 (07/30/2008)  Her updated medication list for this problem includes:    Norvasc 10 Mg Tabs (Amlodipine besylate) .Marland Kitchen... 1 by mouth once daily  BP today: 148/86 Prior BP: 150/90 (09/09/2009)  Prior 10 Yr Risk Heart Disease: Not enough information (09/27/2007)  Labs Reviewed: K+: 3.8 (07/24/2009) Creat: : 0.9 (07/24/2009)   Chol: 209 (07/24/2009)   HDL: 45.00 (07/24/2009)   LDL: DEL (07/30/2008)   TG: 118.0 (07/24/2009)  Complete Medication List: 1)  Norvasc 10 Mg Tabs (Amlodipine besylate) .Marland Kitchen.. 1 by mouth once daily 2)  Nasonex 50 Mcg/act Susp (Mometasone furoate) .... 2 sprays each nostril once daily 3)  Clonazepam 0.5 Mg Tabs (Clonazepam) .... Take  1/2 day as needed Prescriptions: NORVASC 10 MG TABS (AMLODIPINE BESYLATE) 1 by mouth once daily  #30 x 2   Entered and Authorized  by:   Loreen Freud DO   Signed by:   Loreen Freud DO on 10/01/2009   Method used:   Electronically to        [id number] (retail)       [address]       [place], [place]  [address]       Ph: [phone]       Fax: [phone]   RxID:   [id number]

## 2010-09-24 NOTE — Progress Notes (Signed)
Summary: Results--lmtcb 1/4--left cell #  Phone Note Outgoing Call Call back at [phone]=CELL   Call placed by: Almeta Monas CMA Duncan Dull),  August 26, 2010 2:51 PM Call placed to: Patient Details for Reason: cholesterol is great!  LFT elevated.  Is pt taking any otc meds or Tylenol ,  ETOH etc? ------If yes stop and recheck 2 weeks---790.4  hep, ggt, acute hep panal   Summary of Call: Left message to call back wth husband.... Almeta Monas CMA Duncan Dull)  August 26, 2010 2:51 PM   Follow-up for Phone Call        Please call her back on her cell  [phone].[name]  August 26, 2010 3:00 PM  Additional Follow-up for Phone Call Additional follow up Details #1::        spoke with patient and made her aware of the above, stated she had been taking tylenol, I advised to stop and we will recheck her LFT's in 2 week.Marland KitchenMarland KitchenMarland KitchenNext Appointment: 09/09/2010 @8 :20 Additional Follow-up by: Almeta Monas CMA Duncan Dull),  August 26, 2010 3:55 PM

## 2010-10-08 NOTE — Progress Notes (Signed)
Summary: Lipitor Generic  Phone Note Refill Request   Refills Requested: Medication #1:  LIPITOR 10 MG TABS 1 by mouth at bedtime faxed awaiting response......Marland KitchenFelecia Deloach CMA  September 14, 2010 3:22 PM    Follow-up for Phone Call        pt states that her insurance will not pay for the name brand Lipitor. It now has to be generic. Follow-up by: Lavell Islam,  September 18, 2010 10:46 AM  Additional Follow-up for Phone Call Additional follow up Details #1::        Left message to call office........Marland KitchenFelecia Deloach CMA  September 18, 2010 5:06 PM   Pt aware PA in process, Rx sent in to pharmacy...........Marland KitchenFelecia Deloach CMA  September 21, 2010 9:07 AM     Additional Follow-up for Phone Call Additional follow up Details #2::    PA denied, Pt must have tried and failed 2 preferred med: atorvastatin, lovastatin,pravastatin, altoprev, lescol, vytorin. Pls advise..........Marland KitchenFelecia Deloach CMA  September 30, 2010 11:02 AM   Additional Follow-up for Phone Call Additional follow up Details #3:: Details for Additional Follow-up Action Taken: fine--- send in generic lipitor---atorvastatin Additional Follow-up by: Loreen Freud DO,  September 30, 2010 11:24 AM  New/Updated Medications: ATORVASTATIN CALCIUM 10 MG TABS (ATORVASTATIN CALCIUM) Take 1 tab at bedtime Prescriptions: ATORVASTATIN CALCIUM 10 MG TABS (ATORVASTATIN CALCIUM) Take 1 tab at bedtime  #30 x 2   Entered by:   Jeremy Johann CMA   Authorized by:   Loreen Freud DO   Signed by:   Jeremy Johann CMA on 10/01/2010   Method used:   Re-Faxed to ...       [facility] [address] (retail)       [address]       [place], [place]  [address]       Ph: [phone]       Fax: [phone]   RxID:   [id number] LIPITOR 10 MG TABS (ATORVASTATIN CALCIUM) 1 by mouth at bedtime  #30 Not Speci x 2   Entered by:   Jeremy Johann CMA   Authorized by:   Loreen Freud DO   Signed by:   Jeremy Johann CMA on 09/21/2010  Method used:   Faxed to ...       [facility] [address] (retail)       [address]       [place], [place]  [address]       Ph: [phone]       Fax: [phone]   RxID:   [id number]

## 2010-10-08 NOTE — Letter (Signed)
Summary: [facility]   Imported By: Kassie Mends 10/01/2010 09:57:28  _____________________________________________________________________  External Attachment:    Type:   Image     Comment:   External Document

## 2010-10-14 NOTE — Medication Information (Signed)
Summary: Prior Authorization Request for Lipitor  Prior Authorization Request for Lipitor   Imported By: Maryln Gottron 10/08/2010 11:14:23  _____________________________________________________________________  External Attachment:    Type:   Image     Comment:   External Document

## 2010-12-24 ENCOUNTER — Other Ambulatory Visit: Payer: Self-pay | Admitting: Family Medicine

## 2011-01-02 ENCOUNTER — Encounter: Payer: Self-pay | Admitting: Family Medicine

## 2011-01-05 ENCOUNTER — Encounter: Payer: Self-pay | Admitting: Family Medicine

## 2011-01-05 ENCOUNTER — Ambulatory Visit (INDEPENDENT_AMBULATORY_CARE_PROVIDER_SITE_OTHER): Payer: Managed Care, Other (non HMO) | Admitting: Family Medicine

## 2011-01-05 VITALS — BP 138/86 | HR 78 | Temp 98.6°F | Wt 188.8 lb

## 2011-01-05 DIAGNOSIS — E785 Hyperlipidemia, unspecified: Secondary | ICD-10-CM

## 2011-01-05 DIAGNOSIS — I10 Essential (primary) hypertension: Secondary | ICD-10-CM

## 2011-01-05 MED ORDER — BISOPROLOL-HYDROCHLOROTHIAZIDE 10-6.25 MG PO TABS: 1.0000 | ORAL_TABLET | Freq: Every day | ORAL | Status: DC

## 2011-01-05 MED ORDER — ATORVASTATIN CALCIUM 10 MG PO TABS: 10.0000 mg | ORAL_TABLET | Freq: Every day | ORAL | Status: DC

## 2011-01-05 NOTE — Patient Instructions (Signed)

## 2011-01-05 NOTE — Progress Notes (Signed)
  Subjective:    Patient here for follow-up of elevated blood pressure.  She is not exercising and is adherent to a low-salt diet.  Blood pressure is not well controlled at home. Cardiac symptoms: none. Patient denies: chest pain, chest pressure/discomfort, dyspnea, fatigue, irregular heart beat and palpitations. Cardiovascular risk factors: dyslipidemia, hypertension and obesity (BMI >= 30 kg/m2). Use of agents associated with hypertension: none. History of target organ damage: none.  The following portions of the patient's history were reviewed and updated as appropriate: allergies, current medications, past family history, past medical history, past social history, past surgical history and problem list.  Review of Systems Pertinent items are noted in HPI.     Objective:    BP 138/86  Pulse 78  Temp(Src) 98.6 F (37 C) (Oral)  Wt 188 lb 12.8 oz (85.639 kg)  SpO2 98% General appearance: alert, cooperative, appears stated age and no distress Neck: no adenopathy, no carotid bruit, no JVD, supple, symmetrical, trachea midline and thyroid not enlarged, symmetric, no tenderness/mass/nodules Lungs: clear to auscultation bilaterally Heart: regular rate and rhythm, S1, S2 normal, no murmur, click, rub or gallop Extremities: extremities normal, atraumatic, no cyanosis or edema    Assessment:    Hypertension, stage 1 . Evidence of target organ damage: none.    Plan:    Medication: increase to ziac 10/6.25.

## 2011-01-05 NOTE — Assessment & Plan Note (Signed)
con't meds  Check labs 

## 2011-01-19 ENCOUNTER — Ambulatory Visit (INDEPENDENT_AMBULATORY_CARE_PROVIDER_SITE_OTHER): Payer: Managed Care, Other (non HMO) | Admitting: Family Medicine

## 2011-01-19 ENCOUNTER — Encounter: Payer: Self-pay | Admitting: Family Medicine

## 2011-01-19 VITALS — BP 124/82 | HR 65 | Temp 98.2°F | Wt 184.6 lb

## 2011-01-19 DIAGNOSIS — I10 Essential (primary) hypertension: Secondary | ICD-10-CM

## 2011-01-19 LAB — HEPATIC FUNCTION PANEL
AST: 41 U/L — ABNORMAL HIGH (ref 0–37)
Albumin: 4.2 g/dL (ref 3.5–5.2)
Alkaline Phosphatase: 49 U/L (ref 39–117)
Bilirubin, Direct: 0.1 mg/dL (ref 0.0–0.3)
Total Bilirubin: 0.7 mg/dL (ref 0.3–1.2)

## 2011-01-19 LAB — LIPID PANEL
HDL: 53.9 mg/dL (ref >39.00)
LDL Cholesterol: 59 mg/dL (ref 0–99)
Total CHOL/HDL Ratio: 2
VLDL: 17.8 mg/dL (ref 0.0–40.0)

## 2011-01-19 LAB — BASIC METABOLIC PANEL
Calcium: 9.3 mg/dL (ref 8.4–10.5)
GFR: 63.09 mL/min (ref >60.00)
Potassium: 4 mEq/L (ref 3.5–5.1)
Sodium: 141 mEq/L (ref 135–145)

## 2011-01-19 NOTE — Progress Notes (Signed)
  Subjective:    Patient here for follow-up of elevated blood pressure.  She is exercising and is adherent to a low-salt diet.  Blood pressure is well controlled at home. Cardiac symptoms: none. Patient denies: chest pain, dyspnea, exertional chest pressure/discomfort, fatigue, irregular heart beat and palpitations. Cardiovascular risk factors: dyslipidemia and hypertension. Use of agents associated with hypertension: none. History of target organ damage: none.  The following portions of the patient's history were reviewed and updated as appropriate: allergies, current medications, past family history, past medical history, past social history, past surgical history and problem list.  Review of Systems Pertinent items are noted in HPI.     Objective:    BP 124/82  Pulse 65  Temp(Src) 98.2 F (36.8 C) (Oral)  Wt 184 lb 9.6 oz (83.734 kg)  SpO2 98% General appearance: alert, cooperative and no distress Lungs: clear to auscultation bilaterally Heart: regular rate and rhythm, S1, S2 normal, no murmur, click, rub or gallop Extremities: extremities normal, atraumatic, no cyanosis or edema    Assessment:    Hypertension, normal blood pressure . Evidence of target organ damage: none.    Plan:    Medication: no change.

## 2011-01-19 NOTE — Patient Instructions (Signed)

## 2011-01-26 ENCOUNTER — Encounter: Payer: Self-pay | Admitting: *Deleted

## 2011-02-18 ENCOUNTER — Encounter: Payer: Self-pay | Admitting: Family Medicine

## 2011-02-18 ENCOUNTER — Ambulatory Visit (INDEPENDENT_AMBULATORY_CARE_PROVIDER_SITE_OTHER): Payer: Managed Care, Other (non HMO) | Admitting: Family Medicine

## 2011-02-18 VITALS — BP 124/82 | HR 50 | Temp 99.1°F | Wt 184.2 lb

## 2011-02-18 DIAGNOSIS — T7840XA Allergy, unspecified, initial encounter: Secondary | ICD-10-CM

## 2011-02-18 DIAGNOSIS — Z888 Allergy status to other drugs, medicaments and biological substances status: Secondary | ICD-10-CM

## 2011-02-18 DIAGNOSIS — I10 Essential (primary) hypertension: Secondary | ICD-10-CM

## 2011-02-18 MED ORDER — AMLODIPINE BESYLATE 5 MG PO TABS: 5.0000 mg | ORAL_TABLET | Freq: Every day | ORAL | Status: DC

## 2011-02-18 NOTE — Progress Notes (Signed)
  Subjective:     [name] is a 60 y.o. female who presents for evaluation of a rash involving the mouth. Rash started after last visit  when bp med was increased. ago. Lesions are white, and scaley in texture. Rash has changed over time. Rash causes no discomfort. Associated symptoms: none. Patient denies: abdominal pain, arthralgia, congestion, cough, crankiness, decrease in appetite, decrease in energy level, fever, headache, irritability, myalgia, nausea, sore throat and vomiting. Patient has not had contacts with similar rash. Patient has had new exposures (soaps, lotions, laundry detergents, foods, medications, plants, insects or animals).  The following portions of the patient's history were reviewed and updated as appropriate: allergies, current medications, past family history, past medical history, past social history, past surgical history and problem list.  Review of Systems Pertinent items are noted in HPI.    Objective:    BP 124/82  Pulse 50  Temp(Src) 99.1 F (37.3 C) (Oral)  Wt 184 lb 3.2 oz (83.553 kg)  SpO2 97% General:  alert, cooperative, appears stated age and no distress  Skin:  fissure noted on corners of mouth and pt states her tongue feels swollen     Assessment:    allergic reaction ---- ziac?    Plan:    Medications: benadryl and change bp med to  norvasc. Written patient instruction given. Follow up in 2 weeks. -- or sooner prn

## 2011-02-18 NOTE — Patient Instructions (Signed)
Allergic Reaction, Mild to Moderate   Allergies may happen from anything your body is sensitive to. This may be food, medications, pollens, chemicals, and nearly anything around you in everyday life that produces allergens. An allergen is anything that causes an allergy producing substance. Allergens cause your body to release allergic antibodies. Through a chain of events, they cause a release of histamine into the blood stream. Histamines are meant to protect you, but they also cause your discomfort. This is why anti-histamines are often used for allergies. Heredity is often a factor in causing allergic reactions. This means you may have some of the same allergies as your parents.   Allergies happen in all age groups. You may have some idea of what caused your reaction. There are many allergens around us. It may be difficult to know what caused your reaction. If this is a first time event, it may never happen again. Allergies cannot be cured but can be controlled with medications.   SYMPTOMS   You may get some or all of the following problems from allergies.   Swelling and itching in and around the mouth.   Tearing, itchy eyes.   Nasal congestion and runny nose.   Sneezing and coughing.  An itchy red rash or hives.   Vomiting or diarrhea.   Difficulty breathing.    Seasonal allergies occur in all age groups. They are seasonal because they usually occur during the same season every year. They may be a reaction to molds, grass pollens, or tree pollens. Other causes of allergies are house dust mite allergens, pet dander and mold spores. These are just a common few of the thousands of allergens around us. All of the symptoms listed above happen when you come in contact with pollens and other allergens. Seasonal allergies are usually not life threatening. They are generally more of a nuisance that can often be handled using medications.   Hay fever is a combination of all or some of the above listed allergy problems.  It may often be treated with simple over-the-counter medications such as diphenhydramine. Take medication as directed. Check with your caregiver or package insert for child dosages.   TREATMENT AND HOME CARE INSTRUCTIONS   If hives or rash are present:   Take medications as directed.   You may use an over-the-counter antihistamine (diphenhydramine) for hives and itching as needed. Do not drive or drink alcohol until medications used to treat the reaction have worn off. Antihistamines tend to make people sleepy.   Apply cold cloths (compresses) to the skin or take baths in cool water. This will help itching. Avoid hot baths or showers. Heat will make a rash and itching worse.   If your allergies persist and become more severe, and over the counter medications are not effective, there are many new medications your caretaker can prescribe. Immunotherapy or desensitizing injections can be used if all else fails. Follow up with your caregiver if problems continue.   SEEK MEDICAL CARE IF:   Your allergies are becoming progressively more troublesome.   You suspect a food allergy. Symptoms generally happen within 30 minutes of eating a food.   Your symptoms have not gone away within 2 days or are getting worse.   You develop new symptoms.   You want to retest yourself or your child with a food or drink you think causes an allergic reaction. Never test yourself or your child of a suspected allergy without being under the watchful eye of your caregivers.   A second exposure to an allergen may be life threatening.   SEEK IMMEDIATE MEDICAL CARE IF YOU DEVELOP:   Difficulty breathing or wheezing, or have a tight feeling in your chest or throat.   A swollen mouth, hives, swelling, or itching all over your body.   A severe reaction with any of the above problems should be considered life threatening.   If you suddenly develop difficulty breathing call for local emergency medical help. THIS IS AN EMERGENCY.   MAKE SURE YOU:    Understand these instructions.   Will watch your condition.   Will get help right away if you are not doing well or get worse.   Document Released: 06/06/2007 Document Re-Released: 08/31/2009   Exit[employer]® Patient Information ©2011 [employer].

## 2011-03-05 ENCOUNTER — Ambulatory Visit: Payer: Managed Care, Other (non HMO) | Admitting: Family Medicine

## 2011-03-09 ENCOUNTER — Ambulatory Visit: Payer: Managed Care, Other (non HMO) | Admitting: Family Medicine

## 2011-09-30 ENCOUNTER — Ambulatory Visit (INDEPENDENT_AMBULATORY_CARE_PROVIDER_SITE_OTHER): Payer: BC Managed Care – PPO | Admitting: Internal Medicine

## 2011-09-30 ENCOUNTER — Encounter: Payer: Self-pay | Admitting: Internal Medicine

## 2011-09-30 VITALS — BP 158/88 | HR 60 | Temp 97.6°F | Ht 63.5 in | Wt 187.1 lb

## 2011-09-30 DIAGNOSIS — R87619 Unspecified abnormal cytological findings in specimens from cervix uteri: Secondary | ICD-10-CM

## 2011-09-30 DIAGNOSIS — IMO0002 Reserved for concepts with insufficient information to code with codable children: Secondary | ICD-10-CM

## 2011-09-30 DIAGNOSIS — L309 Dermatitis, unspecified: Secondary | ICD-10-CM

## 2011-09-30 DIAGNOSIS — E785 Hyperlipidemia, unspecified: Secondary | ICD-10-CM

## 2011-09-30 DIAGNOSIS — R6889 Other general symptoms and signs: Secondary | ICD-10-CM

## 2011-09-30 DIAGNOSIS — I10 Essential (primary) hypertension: Secondary | ICD-10-CM

## 2011-09-30 DIAGNOSIS — G43909 Migraine, unspecified, not intractable, without status migrainosus: Secondary | ICD-10-CM | POA: Insufficient documentation

## 2011-09-30 DIAGNOSIS — L259 Unspecified contact dermatitis, unspecified cause: Secondary | ICD-10-CM

## 2011-09-30 DIAGNOSIS — M1990 Unspecified osteoarthritis, unspecified site: Secondary | ICD-10-CM

## 2011-09-30 LAB — COMPREHENSIVE METABOLIC PANEL
ALT: 28 U/L (ref 0–35)
AST: 45 U/L — ABNORMAL HIGH (ref 0–37)
Calcium: 10.5 mg/dL (ref 8.4–10.5)
Chloride: 101 mEq/L (ref 96–112)
Creat: 0.87 mg/dL (ref 0.50–1.10)
Sodium: 141 mEq/L (ref 135–145)
Total Bilirubin: 0.7 mg/dL (ref 0.3–1.2)
Total Protein: 7.3 g/dL (ref 6.0–8.3)

## 2011-09-30 LAB — CBC WITH DIFFERENTIAL/PLATELET
Basophils Absolute: 0 10*3/uL (ref 0.0–0.1)
Eosinophils Relative: 2 % (ref 0–5)
Lymphocytes Relative: 29 % (ref 12–46)
MCV: 87.7 fL (ref 78.0–100.0)
Neutrophils Relative %: 61 % (ref 43–77)
Platelets: 233 10*3/uL (ref 150–400)
RBC: 4.94 MIL/uL (ref 3.87–5.11)
RDW: 12.7 % (ref 11.5–15.5)
WBC: 7.4 10*3/uL (ref 4.0–10.5)

## 2011-09-30 LAB — LIPID PANEL
Total CHOL/HDL Ratio: 3 Ratio
VLDL: 35 mg/dL (ref 0–40)

## 2011-09-30 LAB — TSH: TSH: 1.77 u[IU]/mL (ref 0.350–4.500)

## 2011-09-30 MED ORDER — HYDROCORTISONE 2.5 % EX CREA: TOPICAL_CREAM | CUTANEOUS | Status: DC

## 2011-09-30 NOTE — Patient Instructions (Signed)
Labs will be mailed to you  Schedule complete physICAL

## 2011-09-30 NOTE — Progress Notes (Signed)
Subjective:    Patient ID: [name], female    DOB: [birth year], 61 y.o.   MRN: [id number]  HPI  New pt here for first visit.  Former pt of Dr. Laury Axon.  PMH of DJD, HTN, Hyperlipidemia, migraine, remote abnormal mammogram with recent  Normal studies,  And remote abnormal pap smear.     Doing well but has noted for the last few weeks scaly reddened rash on upper lids.  No itching  She did start Rubie Maid eye shadow last April    She notes that she does have "white coat hypertension "  She shows me her outpt. BP's  11/30 137/83  12/4 129/84  1/8  142/80  2/6 151/79.   She is asymptomatic no chest pain no sob minimal edem aof ankles  Allergies  Allergen Reactions  . Sulfonamide Derivatives Other (See Comments)    redness and swelling   Past Medical History  Diagnosis Date  . Hypertension   . Hyperlipidemia   . Allergic rhinitis   . Migraine   . Menopause    Past Surgical History  Procedure Date  . Tonsillectomy   . Breast enhancement surgery 1978    replacement 1997   History   Social History  . Marital Status: Married    Spouse Name: N/A    Number of Children: N/A  . Years of Education: N/A   Occupational History  . [employer]    Social History Main Topics  . Smoking status: Former Smoker    Quit date: 08/23/1980  . Smokeless tobacco: Never Used  . Alcohol Use: 0.6 oz/week    1 Glasses of wine per week  . Drug Use: No  . Sexually Active: Yes   Other Topics Concern  . Not on file   Social History Narrative  . No narrative on file   Family History  Problem Relation Age of Onset  . Ovarian cancer    . Stroke Maternal Grandmother   . Diabetes Maternal Grandmother   . Ovarian cancer Mother     metastatic  . Diabetes Father   . Hypertension Father    Patient Active Problem List  Diagnoses  . HYPERLIPIDEMIA  . HYPERTENSION  . SINUSITIS - ACUTE-NOS  . ALLERGIC RHINITIS  . HOT FLASHES  . FASCIITIS, PLANTAR  . FOOT PAIN, RIGHT  . COUGH  .  POSTMENOPAUSAL STATUS   Current Outpatient Prescriptions on File Prior to Visit  Medication Sig Dispense Refill  . aspirin 81 MG tablet Take 81 mg by mouth daily.        Marland Kitchen atorvastatin (LIPITOR) 10 MG tablet Take 1 tablet (10 mg total) by mouth daily.  90 tablet  3  . cetirizine (ZYRTEC ALLERGY) 10 MG tablet Take 10 mg by mouth as needed.        . fish oil-omega-3 fatty acids 1000 MG capsule Take 1 g by mouth daily.        Marland Kitchen glucosamine-chondroitin 500-400 MG tablet Take 1 tablet by mouth daily.       . Multiple Vitamin (MULTIVITAMIN) tablet Take 1 tablet by mouth daily.        . VENTOLIN HFA 108 (90 BASE) MCG/ACT inhaler Inhale 2 puffs into the lungs 4 (four) times daily as needed.             Review of Systems SEE hpi    Objective:   Physical Exam Physical Exam  Nursing note and vitals reviewed.  Constitutional: She is oriented to  person, place, and time. She appears well-developed and well-nourished.  HENT:  Head: Normocephalic and atraumatic. Eyes'  Slightly reddened scaly rash on both upper eyelids Cardiovascular: Normal rate and regular rhythm. Exam reveals no gallop and no friction rub.  No murmur heard.  Pulmonary/Chest: Breath sounds normal. She has no wheezes. She has no rales.  Neurological: She is alert and oriented to person, place, and time.  Skin: Skin is warm and dry.  Psychiatric: She has a normal mood and affect. Her behavior is normal.         Assessment & Plan:  1) HTN  Not at goal  Pt instructed to check  bp on an outpt basis and will re-evaluate in 3 weeks.  If not at goal will need increase dosing 2)  Hyperlipidemia  Will check today 3)  Dermatitis eyelids  HC creme 2.5%  Daily for 2 weeks 4)  DJD 5)  H/o migraines 60 Menopause 7)  H/o abnormal pap  Will get labs today and pt to schedule CPE

## 2011-10-04 ENCOUNTER — Encounter: Payer: Self-pay | Admitting: Emergency Medicine

## 2011-10-21 ENCOUNTER — Encounter: Payer: Managed Care, Other (non HMO) | Admitting: Internal Medicine

## 2011-10-26 ENCOUNTER — Encounter: Payer: Self-pay | Admitting: Internal Medicine

## 2011-10-26 ENCOUNTER — Other Ambulatory Visit: Payer: Self-pay | Admitting: Internal Medicine

## 2011-10-26 ENCOUNTER — Ambulatory Visit (INDEPENDENT_AMBULATORY_CARE_PROVIDER_SITE_OTHER): Payer: BC Managed Care – PPO | Admitting: Internal Medicine

## 2011-10-26 DIAGNOSIS — Z1231 Encounter for screening mammogram for malignant neoplasm of breast: Secondary | ICD-10-CM

## 2011-10-26 DIAGNOSIS — Z01419 Encounter for gynecological examination (general) (routine) without abnormal findings: Secondary | ICD-10-CM

## 2011-10-26 DIAGNOSIS — IMO0002 Reserved for concepts with insufficient information to code with codable children: Secondary | ICD-10-CM

## 2011-10-26 DIAGNOSIS — Z23 Encounter for immunization: Secondary | ICD-10-CM

## 2011-10-26 DIAGNOSIS — E785 Hyperlipidemia, unspecified: Secondary | ICD-10-CM

## 2011-10-26 DIAGNOSIS — R6889 Other general symptoms and signs: Secondary | ICD-10-CM

## 2011-10-26 DIAGNOSIS — I10 Essential (primary) hypertension: Secondary | ICD-10-CM

## 2011-10-26 DIAGNOSIS — Z113 Encounter for screening for infections with a predominantly sexual mode of transmission: Secondary | ICD-10-CM

## 2011-10-26 DIAGNOSIS — M1990 Unspecified osteoarthritis, unspecified site: Secondary | ICD-10-CM

## 2011-10-26 DIAGNOSIS — Z1272 Encounter for screening for malignant neoplasm of vagina: Secondary | ICD-10-CM

## 2011-10-26 DIAGNOSIS — Z139 Encounter for screening, unspecified: Secondary | ICD-10-CM

## 2011-10-26 LAB — POCT URINALYSIS DIPSTICK
Leukocytes, UA: NEGATIVE
Nitrite, UA: NEGATIVE
Protein, UA: NEGATIVE
Urobilinogen, UA: NEGATIVE
pH, UA: 5

## 2011-10-26 MED ORDER — BISOPROLOL FUMARATE 10 MG PO TABS: 10.0000 mg | ORAL_TABLET | Freq: Every day | ORAL | Status: DC

## 2011-10-26 MED ORDER — HYDROCHLOROTHIAZIDE 25 MG PO TABS: ORAL_TABLET | ORAL | Status: DC

## 2011-10-26 NOTE — Patient Instructions (Signed)
See me in 3 months   Schedule mammogram    Will refer to Gi for colonsocopy

## 2011-10-27 ENCOUNTER — Encounter: Payer: Self-pay | Admitting: Internal Medicine

## 2011-10-27 NOTE — Progress Notes (Signed)
Subjective:    Patient ID: [name], female    DOB: 10/22/1950, 61 y.o.   MRN: [id number]  HPI Kyliegh is here for comprehensive eval.  She reports the [facility] cream helped eyelids and she has worn make up now without rash, redenss or itching  She is due for mammogram and she reports she will schedule this as she would like to get her colonoscopy done first.   Overall doiing well.   She brings copies of her outpt BP and they range from 129-157 systolic with the majority of her SBP being over 140.  Sheis asymptomatic no chest pain no headache, no LeEdema   Allergies  Allergen Reactions  . Sulfonamide Derivatives Other (See Comments)    redness and swelling   Past Medical History  Diagnosis Date  . Hypertension   . Hyperlipidemia   . Allergic rhinitis   . Migraine   . Menopause    Past Surgical History  Procedure Date  . Tonsillectomy   . Breast enhancement surgery 1978    replacement 1997   History   Social History  . Marital Status: Married    Spouse Name: N/A    Number of Children: N/A  . Years of Education: N/A   Occupational History  . [employer]    Social History Main Topics  . Smoking status: Former Smoker    Quit date: 08/23/1980  . Smokeless tobacco: Never Used  . Alcohol Use: 0.6 oz/week    1 Glasses of wine per week  . Drug Use: No  . Sexually Active: Yes   Other Topics Concern  . Not on file   Social History Narrative  . No narrative on file   Family History  Problem Relation Age of Onset  . Ovarian cancer    . Stroke Maternal Grandmother   . Diabetes Maternal Grandmother   . Ovarian cancer Mother     metastatic  . Diabetes Father   . Hypertension Father    Patient Active Problem List  Diagnoses  . HYPERLIPIDEMIA  . HYPERTENSION  . SINUSITIS - ACUTE-NOS  . ALLERGIC RHINITIS  . HOT FLASHES  . FASCIITIS, PLANTAR  . FOOT PAIN, RIGHT  . COUGH  . POSTMENOPAUSAL STATUS  . Abnormal Pap smear  . DJD (degenerative joint disease)    . Migraine   Current Outpatient Prescriptions on File Prior to Visit  Medication Sig Dispense Refill  . aspirin 81 MG tablet Take 81 mg by mouth daily.        Marland Kitchen atorvastatin (LIPITOR) 10 MG tablet Take 1 tablet (10 mg total) by mouth daily.  90 tablet  3  . b complex vitamins tablet Take 1 tablet by mouth daily.      . cetirizine (ZYRTEC ALLERGY) 10 MG tablet Take 10 mg by mouth as needed.        . fish oil-omega-3 fatty acids 1000 MG capsule Take 1 g by mouth daily.        Marland Kitchen glucosamine-chondroitin 500-400 MG tablet Take 1 tablet by mouth daily.       . hydrocortisone 2.5 % cream APPLY TO EYELIDS ONCE A DAY FOR 2 WEEKS  30 g  0  . Multiple Vitamin (MULTIVITAMIN) tablet Take 1 tablet by mouth daily.        . VENTOLIN HFA 108 (90 BASE) MCG/ACT inhaler Inhale 2 puffs into the lungs 4 (four) times daily as needed.       Marland Kitchen VITAMIN C, CALCIUM ASCORBATE, PO Take  1 tablet by mouth daily.            Review of Systems  Constitutional: Negative for unexpected weight change.  HENT: Negative for hearing loss, ear pain and tinnitus.   Eyes: Negative for redness and visual disturbance.  Respiratory: Negative.   Cardiovascular: Negative.   Gastrointestinal: Negative.   Genitourinary: Negative.   Musculoskeletal: Negative.   Neurological: Negative.        Objective:   Physical Exam Physical Exam  Vital signs and nursing note reviewed  Constitutional: She is oriented to person, place, and time. She appears well-developed and well-nourished. She is cooperative.  HENT:  Head: Normocephalic and atraumatic.  Right Ear: Tympanic membrane normal.  Left Ear: Tympanic membrane normal.  Nose: Nose normal.  Mouth/Throat: Oropharynx is clear and moist and mucous membranes are normal. No oropharyngeal exudate or posterior oropharyngeal erythema.  Eyes: Conjunctivae and EOM are normal. Pupils are equal, round, and reactive to light.  Neck: Neck supple. No JVD present. Carotid bruit is not present.  No mass and no thyromegaly present.  Cardiovascular: Regular rhythm, normal heart sounds, intact distal pulses and normal pulses.  Exam reveals no gallop and no friction rub.   No murmur heard. Pulses:      Dorsalis pedis pulses are 2+ on the right side, and 2+ on the left side.  Pulmonary/Chest: Breath sounds normal. She has no wheezes. She has no rhonchi. She has no rales. Right breast exhibits no mass, no nipple discharge and no skin change. Left breast exhibits no mass, no nipple discharge and no skin change.  Abdominal: Soft. Bowel sounds are normal. She exhibits no distension and no mass. There is no hepatosplenomegaly. There is no tenderness. There is no CVA tenderness.  Genitourinary: Rectum normal, vagina normal and uterus normal. Rectal exam shows no mass. Guaiac negative stool. No labial fusion. There is no lesion on the right labia. There is no lesion on the left labia. Cervix exhibits no motion tenderness. Right adnexum displays no mass, no tenderness and no fullness. Left adnexum displays no mass, no tenderness and no fullness. No erythema around the vagina.  Musculoskeletal:       No active synovitis to any joint.    Lymphadenopathy:       Right cervical: No superficial cervical adenopathy present.      Left cervical: No superficial cervical adenopathy present.       Right axillary: No pectoral and no lateral adenopathy present.       Left axillary: No pectoral and no lateral adenopathy present.      Right: No inguinal adenopathy present.       Left: No inguinal adenopathy present.  Neurological: She is alert and oriented to person, place, and time. She has normal strength and normal reflexes. No cranial nerve deficit or sensory deficit. She displays a negative Romberg sign. Coordination and gait normal.  Skin: Skin is warm and dry. No abrasion, no bruising, no ecchymosis and no rash noted. No cyanosis. Nails show no clubbing.  Psychiatric: She has a normal mood and affect. Her  speech is normal and behavior is normal.          Assessment & Plan:  1)  HTN  inadequaately controlled She is on max dose of combined bisoprolol/HCTZ  Will need to take Bisoprolol 10 mg and increast HCTZ to 12.5 mg daily. Recheck BP in 3 months 2) Heatlh maintenance  Will get Tdap today.  Will schedule   Colonoscopy.  Pt states  she will scheudule mammogram  Advised of importance of doing this  See scanned HM sheet 3)eyelid dermatitis   Marked improvement 4)  Hyperlipidemai 5)  History of abnormal pap  Will get today 6)  DJD 7)  MigraiNE        Assessment & Plan:

## 2011-11-08 ENCOUNTER — Encounter: Payer: Self-pay | Admitting: Emergency Medicine

## 2011-11-25 ENCOUNTER — Inpatient Hospital Stay (HOSPITAL_BASED_OUTPATIENT_CLINIC_OR_DEPARTMENT_OTHER): Admission: RE | Admit: 2011-11-25 | Payer: BC Managed Care – PPO | Source: Ambulatory Visit

## 2011-11-30 ENCOUNTER — Encounter: Payer: Self-pay | Admitting: Gastroenterology

## 2011-11-30 ENCOUNTER — Telehealth: Payer: Self-pay | Admitting: Internal Medicine

## 2011-11-30 DIAGNOSIS — Z139 Encounter for screening, unspecified: Secondary | ICD-10-CM

## 2011-11-30 NOTE — Telephone Encounter (Signed)
Pt will see Dr. Christella Hartigan on Dec 22, 2011 9 am; and the nurse on December 08 2011 430 pm   Pt is aware of appointment on 11/30/2011 @ 954 am via [phone]; dates, times, location, and number given

## 2011-11-30 NOTE — Telephone Encounter (Signed)
Will set up GI referral for colonoscopy with [facility]  Ardenia order in chart  Thanks

## 2011-12-06 ENCOUNTER — Telehealth: Payer: Self-pay | Admitting: *Deleted

## 2011-12-06 NOTE — Telephone Encounter (Signed)
Dr. Leone Payor, This pt is coming in for a PV on Wednesday as a direct colonoscopy.  She had a colonoscopy in 2004, which was incomplete.  I do not see in her records that she followed up. She is scheduled right now as a Dr. Christella Hartigan patient but I will change the appointment so that she is with you.  Do you still want her to come in as she is scheduled or come in 2014?  Also, it looks like she should be done on a Propofol day.  I left her chart at your computer upstairs to review.  Thanks, [employer]

## 2011-12-06 NOTE — Telephone Encounter (Signed)
She should have a propofol colonoscopy - so please arrange that  After June 10 I will be all propofol

## 2011-12-06 NOTE — Telephone Encounter (Signed)
Pt notified and appt changed to Dr. Leone Payor with Propofol on 01-13-12 at 9:00.

## 2011-12-08 ENCOUNTER — Ambulatory Visit (AMBULATORY_SURGERY_CENTER): Payer: BC Managed Care – PPO | Admitting: *Deleted

## 2011-12-08 VITALS — Ht 63.0 in | Wt 189.8 lb

## 2011-12-08 DIAGNOSIS — Z1211 Encounter for screening for malignant neoplasm of colon: Secondary | ICD-10-CM

## 2011-12-08 MED ORDER — MOVIPREP 100 G PO SOLR: ORAL | Status: DC

## 2011-12-09 ENCOUNTER — Encounter: Payer: Self-pay | Admitting: Internal Medicine

## 2011-12-22 ENCOUNTER — Encounter: Payer: BC Managed Care – PPO | Admitting: Gastroenterology

## 2011-12-30 ENCOUNTER — Ambulatory Visit (HOSPITAL_BASED_OUTPATIENT_CLINIC_OR_DEPARTMENT_OTHER)
Admission: RE | Admit: 2011-12-30 | Discharge: 2011-12-30 | Disposition: A | Discharge status: Home / Self Care | Payer: BC Managed Care – PPO | Source: Ambulatory Visit | Attending: Internal Medicine | Admitting: Internal Medicine

## 2011-12-30 DIAGNOSIS — Z1231 Encounter for screening mammogram for malignant neoplasm of breast: Secondary | ICD-10-CM | POA: Insufficient documentation

## 2012-01-13 ENCOUNTER — Encounter: Payer: Self-pay | Admitting: Internal Medicine

## 2012-01-13 ENCOUNTER — Ambulatory Visit (AMBULATORY_SURGERY_CENTER): Payer: BC Managed Care – PPO | Admitting: Internal Medicine

## 2012-01-13 VITALS — BP 144/68 | HR 55 | Temp 98.3°F | Resp 20 | Ht 63.0 in | Wt 189.0 lb

## 2012-01-13 DIAGNOSIS — Z1211 Encounter for screening for malignant neoplasm of colon: Secondary | ICD-10-CM

## 2012-01-13 MED ORDER — SODIUM CHLORIDE 0.9 % IV SOLN: 500.0000 mL | INTRAVENOUS | Status: DC

## 2012-01-13 NOTE — Progress Notes (Addendum)
Propofol given and oxygen managed per [facility] CRNA  Abdominal pressure applied per Danley Danker

## 2012-01-13 NOTE — Progress Notes (Signed)
Patient did not experience any of the following events: a burn prior to discharge; a fall within the facility; wrong site/side/patient/procedure/implant event; or a hospital transfer or hospital admission upon discharge from the facility. (G8907) Patient did not have preoperative order for IV antibiotic SSI prophylaxis. (G8918)  

## 2012-01-13 NOTE — Op Note (Signed)
[facility] [address] [place], [place]  [address]  COLONOSCOPY PROCEDURE REPORT  PATIENT:  [name], [name]  MR#:  [id number] BIRTHDATE:  [birth year], 60 yrs. old  GENDER:  female ENDOSCOPIST:  Iva Boop, MD, [facility] REF. BY:  Raechel Chute, M.D. PROCEDURE DATE:  01/13/2012 PROCEDURE:  Colonoscopy 57846 ASA CLASS:  Class II INDICATIONS:  Routine Risk Screening MEDICATIONS:   MAC sedation, administered by CRNA, These medications were titrated to patient response per physician's verbal order, propofol (Diprivan) 300 mg IV  DESCRIPTION OF PROCEDURE:   After the risks benefits and alternatives of the procedure were thoroughly explained, informed consent was obtained.  Digital rectal exam was performed and revealed no abnormalities.   The LB [id number] endoscope was introduced through the anus and advanced to the cecum, which was identified by both the appendix and ileocecal valve, without limitations.  The quality of the prep was excellent, using MoviPrep.  The instrument was then slowly withdrawn as the colon was fully examined. <<PROCEDUREIMAGES>>  FINDINGS:  A normal appearing cecum, ileocecal valve, and appendiceal orifice were identified. The ascending, hepatic flexure, transverse, splenic flexure, descending, sigmoid colon, and rectum appeared unremarkable.   Retroflexed views in the rectum revealed no abnormalities.    The time to cecum = 6:47 minutes. Abdominal pressure used to overcome left-side loop.The scope was then withdrawn in 8:10 minutes from the cecum and the procedure completed. COMPLICATIONS:  None ENDOSCOPIC IMPRESSION: 1) Normal colonoscopy with excellent prep  REPEAT EXAM:  In 10 year(s) for routine screening colonoscopy.  Iva Boop, MD, [facility]  CC:  Raechel Chute, MD and The Patient  n. eSIGNED:   Iva Boop at 01/13/2012 08:57 AM  Gena Fray, [phone]

## 2012-01-13 NOTE — Patient Instructions (Signed)
YOU HAD AN ENDOSCOPIC PROCEDURE TODAY AT THE [facility]: Refer to the procedure report that was given to you for any specific questions about what was found during the examination.  If the procedure report does not answer your questions, please call your gastroenterologist to clarify.  If you requested that your care partner not be given the details of your procedure findings, then the procedure report has been included in a sealed envelope for you to review at your convenience later.  YOU SHOULD EXPECT: Some feelings of bloating in the abdomen. Passage of more gas than usual.  Walking can help get rid of the air that was put into your GI tract during the procedure and reduce the bloating. If you had a lower endoscopy (such as a colonoscopy or flexible sigmoidoscopy) you may notice spotting of blood in your stool or on the toilet paper. If you underwent a bowel prep for your procedure, then you may not have a normal bowel movement for a few days.  DIET: Your first meal following the procedure should be a light meal and then it is ok to progress to your normal diet.  A half-sandwich or bowl of soup is an example of a good first meal.  Heavy or fried foods are harder to digest and may make you feel nauseous or bloated.  Likewise meals heavy in dairy and vegetables can cause extra gas to form and this can also increase the bloating.  Drink plenty of fluids but you should avoid alcoholic beverages for 24 hours.  ACTIVITY: Your care partner should take you home directly after the procedure.  You should plan to take it easy, moving slowly for the rest of the day.  You can resume normal activity the day after the procedure however you should NOT DRIVE or use heavy machinery for 24 hours (because of the sedation medicines used during the test).    SYMPTOMS TO REPORT IMMEDIATELY: A gastroenterologist can be reached at any hour.  During normal business hours, 8:30 AM to 5:00 PM Monday through Friday,  call [phone].  After hours and on weekends, please call the GI answering service at [phone] who will take a message and have the physician on call contact you.   Following lower endoscopy (colonoscopy or flexible sigmoidoscopy):  Excessive amounts of blood in the stool  Significant tenderness or worsening of abdominal pains  Swelling of the abdomen that is new, acute  Fever of 100F or higher  Following upper endoscopy (EGD)  Vomiting of blood or coffee ground material  New chest pain or pain under the shoulder blades  Painful or persistently difficult swallowing  New shortness of breath  Fever of 100F or higher  Black, tarry-looking stools  FOLLOW UP: If any biopsies were taken you will be contacted by phone or by letter within the next 1-3 weeks.  Call your gastroenterologist if you have not heard about the biopsies in 3 weeks.  Our staff will call the home number listed on your records the next business day following your procedure to check on you and address any questions or concerns that you may have at that time regarding the information given to you following your procedure. This is a courtesy call and so if there is no answer at the home number and we have not heard from you through the emergency physician on call, we will assume that you have returned to your regular daily activities without incident.  SIGNATURES/CONFIDENTIALITY: You and/or your care   partner have signed paperwork which will be entered into your electronic medical record.  These signatures attest to the fact that that the information above on your After Visit Summary has been reviewed and is understood.  Full responsibility of the confidentiality of this discharge information lies with you and/or your care-partner.   Next colonoscopy in 10 years. 

## 2012-01-14 ENCOUNTER — Telehealth: Payer: Self-pay | Admitting: *Deleted

## 2012-01-14 NOTE — Telephone Encounter (Signed)
No answer, message left for the patient. 

## 2012-01-26 ENCOUNTER — Other Ambulatory Visit: Payer: Self-pay | Admitting: *Deleted

## 2012-01-26 ENCOUNTER — Ambulatory Visit (INDEPENDENT_AMBULATORY_CARE_PROVIDER_SITE_OTHER): Payer: BC Managed Care – PPO | Admitting: Internal Medicine

## 2012-01-26 ENCOUNTER — Encounter: Payer: Self-pay | Admitting: Internal Medicine

## 2012-01-26 VITALS — BP 160/84 | HR 60 | Temp 96.9°F | Resp 16 | Ht 63.5 in | Wt 190.0 lb

## 2012-01-26 DIAGNOSIS — E785 Hyperlipidemia, unspecified: Secondary | ICD-10-CM

## 2012-01-26 DIAGNOSIS — I10 Essential (primary) hypertension: Secondary | ICD-10-CM

## 2012-01-26 MED ORDER — ATORVASTATIN CALCIUM 10 MG PO TABS: 10.0000 mg | ORAL_TABLET | Freq: Every day | ORAL | Status: DC

## 2012-01-26 MED ORDER — LISINOPRIL-HYDROCHLOROTHIAZIDE 20-25 MG PO TABS: 1.0000 | ORAL_TABLET | Freq: Every day | ORAL | Status: DC

## 2012-01-26 NOTE — Patient Instructions (Signed)
Take Zebeta one tab every other day for one week then stop  Start Lisinopril /hctz tomorrow  See me in 3 weeks

## 2012-01-26 NOTE — Progress Notes (Signed)
Subjective:    Patient ID: [name], female    DOB: 1950-09-09, 61 y.o.   MRN: [id number]  HPI  [name] is here for follow up on BP.  She has been taking  Zebeta   And 12.5 mg of HCTZ.  She brings a copy of her outpt bp's taken in early am.  SBP range 129-152 and diastolic 73-84.  She is asymptomatic.  No headache no chest pain no edema  Allergies  Allergen Reactions  . Sulfonamide Derivatives Other (See Comments)    redness and swelling   Past Medical History  Diagnosis Date  . Hypertension   . Hyperlipidemia   . Allergic rhinitis   . Migraine   . Menopause    Past Surgical History  Procedure Date  . Tonsillectomy   . Breast enhancement surgery 1978    replacement 1997   History   Social History  . Marital Status: Married    Spouse Name: N/A    Number of Children: N/A  . Years of Education: N/A   Occupational History  . [employer]    Social History Main Topics  . Smoking status: Former Smoker    Quit date: 08/23/1980  . Smokeless tobacco: Never Used  . Alcohol Use: 0.6 oz/week    1 Glasses of wine per week  . Drug Use: No  . Sexually Active: Yes   Other Topics Concern  . Not on file   Social History Narrative  . No narrative on file   Family History  Problem Relation Age of Onset  . Ovarian cancer    . Stroke Maternal Grandmother   . Diabetes Maternal Grandmother   . Ovarian cancer Mother     metastatic  . Diabetes Father   . Hypertension Father    Patient Active Problem List  Diagnoses  . HYPERLIPIDEMIA  . HYPERTENSION  . SINUSITIS - ACUTE-NOS  . ALLERGIC RHINITIS  . HOT FLASHES  . FASCIITIS, PLANTAR  . FOOT PAIN, RIGHT  . COUGH  . POSTMENOPAUSAL STATUS  . Abnormal Pap smear  . DJD (degenerative joint disease)  . Migraine   Current Outpatient Prescriptions on File Prior to Visit  Medication Sig Dispense Refill  . aspirin 81 MG tablet Take 81 mg by mouth daily.        Marland Kitchen b complex vitamins tablet Take 1 tablet by mouth  daily.      . cetirizine (ZYRTEC ALLERGY) 10 MG tablet Take 10 mg by mouth as needed.        . fish oil-omega-3 fatty acids 1000 MG capsule Take 1 g by mouth daily.        Marland Kitchen glucosamine-chondroitin 500-400 MG tablet Take 1 tablet by mouth daily.       . Multiple Vitamin (MULTIVITAMIN) tablet Take 1 tablet by mouth daily.        . VENTOLIN HFA 108 (90 BASE) MCG/ACT inhaler Inhale 2 puffs into the lungs 4 (four) times daily as needed.       Marland Kitchen VITAMIN C, CALCIUM ASCORBATE, PO Take 1 tablet by mouth daily.      Marland Kitchen DISCONTD: atorvastatin (LIPITOR) 10 MG tablet Take 1 tablet (10 mg total) by mouth daily.  90 tablet  3  . hydrocortisone 2.5 % cream APPLY TO EYELIDS ONCE A DAY FOR 2 WEEKS  30 g  0  . lisinopril-hydrochlorothiazide (PRINZIDE,ZESTORETIC) 20-25 MG per tablet Take 1 tablet by mouth daily.  30 tablet  1      Review  of Systems See HPI    Objective:   Physical Exam Physical Exam  Nursing note and vitals reviewed.  Constitutional: She is oriented to person, place, and time. She appears well-developed and well-nourished.  HENT:  Head: Normocephalic and atraumatic.  Cardiovascular: Normal rate and regular rhythm. Exam reveals no gallop and no friction rub.  No murmur heard.  Pulmonary/Chest: Breath sounds normal. She has no wheezes. She has no rales. Ext no edema  Neurological: She is alert and oriented to person, place, and time.  Skin: Skin is warm and dry.  Psychiatric: She has a normal mood and affect. Her behavior is normal.              Assessment & Plan:  HTN uncontolled will taper of Zebeta and give Lisinopril/hctz 20/25 to take one daily.  Taper off beta blocker one tab qod for one week then stop  See me in 3 weeks

## 2012-02-15 ENCOUNTER — Telehealth: Payer: Self-pay

## 2012-02-15 NOTE — Telephone Encounter (Signed)
Due to insurance and not being able to pay her copayment she has to pay $120 every time she comes in for a visit, pt would like to know if she can give you her BP # she take herself at home over the phone instead of coming in on Thursday.

## 2012-02-16 NOTE — Telephone Encounter (Signed)
[name]    Call pt and record her outpt. Blood pressures.  Tell her it is oK to see me in 2 months as I want to hear her  bp myself and I need to check her K at that time  Message back with BP readings

## 2012-02-17 ENCOUNTER — Ambulatory Visit: Payer: BC Managed Care – PPO | Admitting: Internal Medicine

## 2012-02-17 ENCOUNTER — Telehealth: Payer: Self-pay | Admitting: *Deleted

## 2012-02-17 NOTE — Telephone Encounter (Signed)
Recent blood pressure readings per pt. 6/12 143/75 6/19 133/78 6/20 140/79 6/21 139/79 6/23 141/86 6/24 137/82 6/25 139/77 Her next appt with Dr Constance Goltz is 04/03/12  For BP check and to have K checked.

## 2012-03-20 ENCOUNTER — Other Ambulatory Visit: Payer: Self-pay | Admitting: Internal Medicine

## 2012-03-20 NOTE — Telephone Encounter (Signed)
[name]   Call pt and let her know that I need to check her K as she is on a diuretic and can lose K with the medicine.  I refillled 3 days of lisinopril/hctz.  Have her see me in office sometime before then.    Message back with appt date  (open encounter and route back)

## 2012-03-21 NOTE — Telephone Encounter (Signed)
She is scheduled on 04/03/12 to see you for BP check.

## 2012-03-29 ENCOUNTER — Telehealth: Payer: Self-pay | Admitting: Internal Medicine

## 2012-03-29 NOTE — Telephone Encounter (Signed)
Pt called wanting to be seen for an uti; advise to go an urgent care... She stated she will continue to drink water and cranberry juice.Marland Kitchen 08/05

## 2012-04-03 ENCOUNTER — Encounter: Payer: Self-pay | Admitting: Internal Medicine

## 2012-04-03 ENCOUNTER — Ambulatory Visit (INDEPENDENT_AMBULATORY_CARE_PROVIDER_SITE_OTHER): Payer: BC Managed Care – PPO | Admitting: Internal Medicine

## 2012-04-03 VITALS — BP 138/80 | HR 91 | Temp 97.0°F | Resp 16 | Ht 63.0 in | Wt 189.0 lb

## 2012-04-03 DIAGNOSIS — I10 Essential (primary) hypertension: Secondary | ICD-10-CM

## 2012-04-03 DIAGNOSIS — R7989 Other specified abnormal findings of blood chemistry: Secondary | ICD-10-CM

## 2012-04-03 DIAGNOSIS — E785 Hyperlipidemia, unspecified: Secondary | ICD-10-CM

## 2012-04-03 LAB — COMPREHENSIVE METABOLIC PANEL
ALT: 25 U/L (ref 0–35)
Alkaline Phosphatase: 50 U/L (ref 39–117)
Glucose, Bld: 147 mg/dL — ABNORMAL HIGH (ref 70–99)
Sodium: 139 mEq/L (ref 135–145)
Total Bilirubin: 0.6 mg/dL (ref 0.3–1.2)
Total Protein: 6.6 g/dL (ref 6.0–8.3)

## 2012-04-03 MED ORDER — LISINOPRIL-HYDROCHLOROTHIAZIDE 20-25 MG PO TABS: 1.0000 | ORAL_TABLET | Freq: Every day | ORAL | Status: DC

## 2012-04-03 NOTE — Patient Instructions (Addendum)
Labs will be mailed to you  If liver test abnormal  Will discuss any further management

## 2012-04-03 NOTE — Progress Notes (Signed)
Subjective:    Patient ID: [name], female    DOB: 08-30-1950, 61 y.o.   MRN: [id number]  HPI [name] is here for follow up after initiating Lisinopril/HCTZ 20/25.  Tolerating well  She brings copie sof outpt.  BP's and most systolic are in 130's and diastolics are in 70's.  Only one episode where SBP 142 that she states was after walking dog  She had urinary frequency last week but that has all resolved now. I offered to check her urine but she does not wish me to check her urine today   See LFT"S mild elevation of AST that is persistant.  She is on 10 mg of Lipitor  Allergies  Allergen Reactions  . Sulfonamide Derivatives Other (See Comments)    redness and swelling   Past Medical History  Diagnosis Date  . Hypertension   . Hyperlipidemia   . Allergic rhinitis   . Migraine   . Menopause    Past Surgical History  Procedure Date  . Tonsillectomy   . Breast enhancement surgery 1978    replacement 1997   History   Social History  . Marital Status: Married    Spouse Name: N/A    Number of Children: N/A  . Years of Education: N/A   Occupational History  . [employer]    Social History Main Topics  . Smoking status: Former Smoker    Quit date: 08/23/1980  . Smokeless tobacco: Never Used  . Alcohol Use: 0.6 oz/week    1 Glasses of wine per week  . Drug Use: No  . Sexually Active: Yes   Other Topics Concern  . Not on file   Social History Narrative  . No narrative on file   Family History  Problem Relation Age of Onset  . Ovarian cancer    . Stroke Maternal Grandmother   . Diabetes Maternal Grandmother   . Ovarian cancer Mother     metastatic  . Diabetes Father   . Hypertension Father    Patient Active Problem List  Diagnosis  . HYPERLIPIDEMIA  . HYPERTENSION  . SINUSITIS - ACUTE-NOS  . ALLERGIC RHINITIS  . HOT FLASHES  . FASCIITIS, PLANTAR  . FOOT PAIN, RIGHT  . COUGH  . POSTMENOPAUSAL STATUS  . Abnormal Pap smear  . DJD  (degenerative joint disease)  . Migraine   Current Outpatient Prescriptions on File Prior to Visit  Medication Sig Dispense Refill  . aspirin 81 MG tablet Take 81 mg by mouth daily.        Marland Kitchen atorvastatin (LIPITOR) 10 MG tablet Take 1 tablet (10 mg total) by mouth daily.  90 tablet  3  . b complex vitamins tablet Take 1 tablet by mouth daily.      . cetirizine (ZYRTEC ALLERGY) 10 MG tablet Take 10 mg by mouth as needed.        . fish oil-omega-3 fatty acids 1000 MG capsule Take 1 g by mouth daily.        Marland Kitchen glucosamine-chondroitin 500-400 MG tablet Take 1 tablet by mouth daily.       . hydrocortisone 2.5 % cream APPLY TO EYELIDS ONCE A DAY FOR 2 WEEKS  30 g  0  . lisinopril-hydrochlorothiazide (PRINZIDE,ZESTORETIC) 20-25 MG per tablet TAKE 1 TABLET BY MOUTH DAILY.  30 tablet  0  . Multiple Vitamin (MULTIVITAMIN) tablet Take 1 tablet by mouth daily.        . VENTOLIN HFA 108 (90 BASE) MCG/ACT inhaler Inhale  2 puffs into the lungs 4 (four) times daily as needed.       Marland Kitchen VITAMIN C, CALCIUM ASCORBATE, PO Take 1 tablet by mouth daily.           Review of Systems See HPI    Objective:   Physical Exam Physical Exam  Nursing note and vitals reviewed.  Constitutional: She is oriented to person, place, and time. She appears well-developed and well-nourished.  HENT:  Head: Normocephalic and atraumatic.  Cardiovascular: Normal rate and regular rhythm. Exam reveals no gallop and no friction rub.  No murmur heard.  Pulmonary/Chest: Breath sounds normal. She has no wheezes. She has no rales.  Neurological: She is alert and oriented to person, place, and time.  Skin: Skin is warm and dry.  Psychiatric: She has a normal mood and affect. Her behavior is normal.  Ext:  No edema             Assessment & Plan:  HTN:  At goal  Will check K today  Lft elevation:  May be due to Lipitor  But will check Hepatitis panel.  Further management based on results  Urinary frequency symptomatically  improved.

## 2012-04-04 LAB — HEPATITIS PANEL, ACUTE
HCV Ab: NEGATIVE
Hep B C IgM: NEGATIVE

## 2012-04-06 ENCOUNTER — Telehealth: Payer: Self-pay | Admitting: *Deleted

## 2012-04-06 NOTE — Telephone Encounter (Signed)
Pt notified via phone that her liver function was 38 which is down from previous lab work.  For all practical reasons Dr Constance Goltz considered it normal and a liver u/s is not necessary.  Copy of labs mailed to pt's home address.

## 2012-05-31 ENCOUNTER — Ambulatory Visit (INDEPENDENT_AMBULATORY_CARE_PROVIDER_SITE_OTHER): Payer: BC Managed Care – PPO | Admitting: Internal Medicine

## 2012-05-31 ENCOUNTER — Encounter: Payer: Self-pay | Admitting: Internal Medicine

## 2012-05-31 VITALS — BP 138/74 | HR 93 | Temp 98.3°F | Resp 20 | Wt 188.0 lb

## 2012-05-31 DIAGNOSIS — J40 Bronchitis, not specified as acute or chronic: Secondary | ICD-10-CM

## 2012-05-31 DIAGNOSIS — R059 Cough, unspecified: Secondary | ICD-10-CM

## 2012-05-31 DIAGNOSIS — R05 Cough: Secondary | ICD-10-CM

## 2012-05-31 MED ORDER — HYDROCOD POLST-CHLORPHEN POLST 10-8 MG/5ML PO LQCR: 5.0000 mL | Freq: Two times a day (BID) | ORAL | Status: DC | PRN

## 2012-05-31 MED ORDER — AZITHROMYCIN 250 MG PO TABS: ORAL_TABLET | ORAL | Status: DC

## 2012-05-31 NOTE — Progress Notes (Signed)
Subjective:    Patient ID: [name], female    DOB: 03-Feb-1951, 61 y.o.   MRN: [id number]  HPI Edris is here with acute visit.  8 days of cough nasal congestion.  Cough productive of white mucpous  No fever no chest pain  Allergies  Allergen Reactions  . Sulfonamide Derivatives Other (See Comments)    redness and swelling   Past Medical History  Diagnosis Date  . Hypertension   . Hyperlipidemia   . Allergic rhinitis   . Migraine   . Menopause    Past Surgical History  Procedure Date  . Tonsillectomy   . Breast enhancement surgery 1978    replacement 1997   History   Social History  . Marital Status: Married    Spouse Name: N/A    Number of Children: N/A  . Years of Education: N/A   Occupational History  . [employer]    Social History Main Topics  . Smoking status: Former Smoker    Quit date: 08/23/1980  . Smokeless tobacco: Never Used  . Alcohol Use: 0.6 oz/week    1 Glasses of wine per week  . Drug Use: No  . Sexually Active: Yes   Other Topics Concern  . Not on file   Social History Narrative  . No narrative on file   Family History  Problem Relation Age of Onset  . Ovarian cancer    . Stroke Maternal Grandmother   . Diabetes Maternal Grandmother   . Ovarian cancer Mother     metastatic  . Diabetes Father   . Hypertension Father    Patient Active Problem List  Diagnosis  . HYPERLIPIDEMIA  . HYPERTENSION  . SINUSITIS - ACUTE-NOS  . ALLERGIC RHINITIS  . HOT FLASHES  . FASCIITIS, PLANTAR  . FOOT PAIN, RIGHT  . COUGH  . POSTMENOPAUSAL STATUS  . Abnormal Pap smear  . DJD (degenerative joint disease)  . Migraine  . Elevated liver function tests   Current Outpatient Prescriptions on File Prior to Visit  Medication Sig Dispense Refill  . aspirin 81 MG tablet Take 81 mg by mouth daily.        Marland Kitchen atorvastatin (LIPITOR) 10 MG tablet Take 1 tablet (10 mg total) by mouth daily.  90 tablet  3  . b complex vitamins tablet Take 1  tablet by mouth daily.      . fish oil-omega-3 fatty acids 1000 MG capsule Take 1 g by mouth daily.        Marland Kitchen glucosamine-chondroitin 500-400 MG tablet Take 1 tablet by mouth daily.       Marland Kitchen lisinopril-hydrochlorothiazide (PRINZIDE,ZESTORETIC) 20-25 MG per tablet Take 1 tablet by mouth daily.  90 tablet  1  . Multiple Vitamin (MULTIVITAMIN) tablet Take 1 tablet by mouth daily.        . VENTOLIN HFA 108 (90 BASE) MCG/ACT inhaler Inhale 2 puffs into the lungs 4 (four) times daily as needed.       Marland Kitchen VITAMIN C, CALCIUM ASCORBATE, PO Take 1 tablet by mouth daily.      . cetirizine (ZYRTEC ALLERGY) 10 MG tablet Take 10 mg by mouth as needed.        . hydrocortisone 2.5 % cream APPLY TO EYELIDS ONCE A DAY FOR 2 WEEKS  30 g  0       Review of Systems    see HPI Objective:   Physical Exam  Physical Exam  Nursing note and vitals reviewed.  Constitutional: She  is oriented to person, place, and time. She appears well-developed and well-nourished. She is cooperative.  HENT:  Head: Normocephalic and atraumatic.  Nose: Mucosal edema present.  Eyes: Conjunctivae and EOM are normal. Pupils are equal, round, and reactive to light.  Neck: Neck supple.  Cardiovascular: Regular rhythm, normal heart sounds, intact distal pulses and normal pulses. Exam reveals no gallop and no friction rub.  No murmur heard.  Pulmonary/Chest: She has no wheezes. She has rhonchi. She has no rales.  Neurological: She is alert and oriented to person, place, and time.  Skin: Skin is warm and dry. No abrasion, no bruising, no ecchymosis and no rash noted. No cyanosis. Nails show no clubbing.  Psychiatric: She has a normal mood and affect. Her speech is normal and behavior is normal.           Assessment & Plan:  bronvchitis  z-pak  Cough  tussionex

## 2012-05-31 NOTE — Patient Instructions (Addendum)
See me as needed 

## 2012-07-05 ENCOUNTER — Telehealth: Payer: Self-pay | Admitting: *Deleted

## 2012-07-05 DIAGNOSIS — F418 Other specified anxiety disorders: Secondary | ICD-10-CM | POA: Insufficient documentation

## 2012-07-05 MED ORDER — LORAZEPAM 0.5 MG PO TABS: 0.5000 mg | ORAL_TABLET | Freq: Two times a day (BID) | ORAL | Status: DC | PRN

## 2012-07-05 NOTE — Telephone Encounter (Signed)
Pt's husband has recently been dx with inoperable brain ca. Pt is not able to make an appt at this time due to situation pt is requesting something to help with her emotional state. Pt is tearful and anxious sounding on phone.

## 2012-07-05 NOTE — Telephone Encounter (Signed)
Pt wishes  medication called in to [facility]

## 2012-07-05 NOTE — Telephone Encounter (Signed)
Medication called in to [facility]

## 2012-07-05 NOTE — Telephone Encounter (Signed)
[name]  Tell Dominika from me that I am so sorry to hear this new.    I ordered Ativan for her for anxiety.  Call in for her  Pharmacy listed in Epic in [facility]

## 2012-07-31 ENCOUNTER — Telehealth: Payer: Self-pay | Admitting: *Deleted

## 2012-08-01 NOTE — Telephone Encounter (Signed)
error 

## 2012-08-02 ENCOUNTER — Encounter: Payer: Self-pay | Admitting: Internal Medicine

## 2012-08-02 ENCOUNTER — Ambulatory Visit (INDEPENDENT_AMBULATORY_CARE_PROVIDER_SITE_OTHER): Payer: BC Managed Care – PPO | Admitting: Internal Medicine

## 2012-08-02 VITALS — BP 146/86 | HR 103 | Temp 97.7°F | Resp 18 | Wt 189.0 lb

## 2012-08-02 DIAGNOSIS — I10 Essential (primary) hypertension: Secondary | ICD-10-CM

## 2012-08-02 DIAGNOSIS — F419 Anxiety disorder, unspecified: Secondary | ICD-10-CM

## 2012-08-02 DIAGNOSIS — F411 Generalized anxiety disorder: Secondary | ICD-10-CM

## 2012-08-02 MED ORDER — LORAZEPAM 0.5 MG PO TABS: 0.5000 mg | ORAL_TABLET | Freq: Two times a day (BID) | ORAL | Status: DC | PRN

## 2012-08-02 NOTE — Progress Notes (Signed)
Subjective:    Patient ID: [name], female    DOB: 09-25-1950, 61 y.o.   MRN: [id number]  HPI [name] is here for follow up.  She is very tearful, crying for most of interview.  Her husband has inoperable brain cancer and she is the sole caretaker.    Ativan is helpful.  She is sleeping at night.   She denies depression or anhedonia now  Husband is taking XRT.  He is confused at home    Allergies  Allergen Reactions  . Sulfonamide Derivatives Other (See Comments)    redness and swelling   Past Medical History  Diagnosis Date  . Hypertension   . Hyperlipidemia   . Allergic rhinitis   . Migraine   . Menopause    Past Surgical History  Procedure Date  . Tonsillectomy   . Breast enhancement surgery 1978    replacement 1997   History   Social History  . Marital Status: Married    Spouse Name: N/A    Number of Children: N/A  . Years of Education: N/A   Occupational History  . [employer]    Social History Main Topics  . Smoking status: Former Smoker    Quit date: 08/23/1980  . Smokeless tobacco: Never Used  . Alcohol Use: 0.6 oz/week    1 Glasses of wine per week  . Drug Use: No  . Sexually Active: Yes   Other Topics Concern  . Not on file   Social History Narrative  . No narrative on file   Family History  Problem Relation Age of Onset  . Ovarian cancer    . Stroke Maternal Grandmother   . Diabetes Maternal Grandmother   . Ovarian cancer Mother     metastatic  . Diabetes Father   . Hypertension Father    Patient Active Problem List  Diagnosis  . HYPERLIPIDEMIA  . HYPERTENSION  . SINUSITIS - ACUTE-NOS  . ALLERGIC RHINITIS  . HOT FLASHES  . FASCIITIS, PLANTAR  . FOOT PAIN, RIGHT  . COUGH  . POSTMENOPAUSAL STATUS  . Abnormal Pap smear  . DJD (degenerative joint disease)  . Migraine  . Elevated liver function tests  . Situational anxiety  . Anxiety   Current Outpatient Prescriptions on File Prior to Visit  Medication Sig  Dispense Refill  . aspirin 81 MG tablet Take 81 mg by mouth daily.        Marland Kitchen atorvastatin (LIPITOR) 10 MG tablet Take 1 tablet (10 mg total) by mouth daily.  90 tablet  3  . fish oil-omega-3 fatty acids 1000 MG capsule Take 1 g by mouth daily.        Marland Kitchen glucosamine-chondroitin 500-400 MG tablet Take 1 tablet by mouth daily.       . hydrocortisone 2.5 % cream APPLY TO EYELIDS ONCE A DAY FOR 2 WEEKS  30 g  0  . lisinopril-hydrochlorothiazide (PRINZIDE,ZESTORETIC) 20-25 MG per tablet Take 1 tablet by mouth daily.  90 tablet  1  . VENTOLIN HFA 108 (90 BASE) MCG/ACT inhaler Inhale 2 puffs into the lungs 4 (four) times daily as needed.       Marland Kitchen VITAMIN C, CALCIUM ASCORBATE, PO Take 1 tablet by mouth daily.      Marland Kitchen b complex vitamins tablet Take 1 tablet by mouth daily.      . cetirizine (ZYRTEC ALLERGY) 10 MG tablet Take 10 mg by mouth as needed.        . chlorpheniramine-HYDROcodone (TUSSIONEX PENNKINETIC  ER) 10-8 MG/5ML LQCR Take 5 mLs by mouth every 12 (twelve) hours as needed.  140 mL  0  . Multiple Vitamin (MULTIVITAMIN) tablet Take 1 tablet by mouth daily.             Review of Systems See HPI    Objective:   Physical Exam Physical Exam  Nursing note and vitals reviewed.  Crying most of interview Constitutional: She is oriented to person, place, and time. She appears well-developed and well-nourished.  HENT:  Head: Normocephalic and atraumatic.  Cardiovascular: Normal rate and regular rhythm. Exam reveals no gallop and no friction rub.  No murmur heard.  Pulmonary/Chest: Breath sounds normal. She has no wheezes. She has no rales.  Neurological: She is alert and oriented to person, place, and time.  Skin: Skin is warm and dry.  Psychiatric: She has a normal mood and affect. Her behavior is normal.             Assessment & Plan:  Anxiety  OK to Ativan 0.5 mg bid #60 one refill.  She reports she has a Veterinary surgeon at [employer] if she needs it  HTN:  Bp elevated today .  Pt however is  very stressed

## 2012-08-02 NOTE — Patient Instructions (Addendum)
See me as needwed

## 2012-09-16 ENCOUNTER — Ambulatory Visit: Payer: BC Managed Care – PPO

## 2012-10-23 ENCOUNTER — Other Ambulatory Visit: Payer: Self-pay | Admitting: Internal Medicine

## 2012-10-23 NOTE — Telephone Encounter (Signed)
Refill request

## 2012-10-25 ENCOUNTER — Telehealth: Payer: Self-pay | Admitting: *Deleted

## 2012-10-25 NOTE — Telephone Encounter (Signed)
Refill request

## 2012-11-23 ENCOUNTER — Other Ambulatory Visit: Payer: Self-pay | Admitting: *Deleted

## 2012-11-23 MED ORDER — LORAZEPAM 0.5 MG PO TABS: 0.5000 mg | ORAL_TABLET | Freq: Two times a day (BID) | ORAL | Status: DC | PRN

## 2012-11-23 NOTE — Telephone Encounter (Signed)
Pt she needs the LORazepam (ATIVAN) 0.5 MG tablet For  Those days that are not as good as other... Pharmacy sent the second request, but pt wanted to call and state she is not taking meds all the time, but as needed... pls call if any questions [phone]

## 2012-11-23 NOTE — Telephone Encounter (Signed)
Will call in pending approval 

## 2012-11-23 NOTE — Telephone Encounter (Signed)
Ativan called in to [facility] out pt pharmacy

## 2013-01-01 ENCOUNTER — Other Ambulatory Visit: Payer: Self-pay | Admitting: Internal Medicine

## 2013-01-01 ENCOUNTER — Ambulatory Visit (INDEPENDENT_AMBULATORY_CARE_PROVIDER_SITE_OTHER): Payer: BC Managed Care – PPO | Admitting: Internal Medicine

## 2013-01-01 ENCOUNTER — Encounter: Payer: Self-pay | Admitting: Internal Medicine

## 2013-01-01 ENCOUNTER — Ambulatory Visit (HOSPITAL_BASED_OUTPATIENT_CLINIC_OR_DEPARTMENT_OTHER)
Admission: RE | Admit: 2013-01-01 | Discharge: 2013-01-01 | Disposition: A | Discharge status: Home / Self Care | Payer: BC Managed Care – PPO | Source: Ambulatory Visit | Attending: Internal Medicine | Admitting: Internal Medicine

## 2013-01-01 VITALS — BP 144/84 | HR 67 | Temp 97.1°F | Resp 18 | Ht 63.5 in | Wt 175.0 lb

## 2013-01-01 DIAGNOSIS — F418 Other specified anxiety disorders: Secondary | ICD-10-CM

## 2013-01-01 DIAGNOSIS — R6889 Other general symptoms and signs: Secondary | ICD-10-CM

## 2013-01-01 DIAGNOSIS — Z139 Encounter for screening, unspecified: Secondary | ICD-10-CM

## 2013-01-01 DIAGNOSIS — J309 Allergic rhinitis, unspecified: Secondary | ICD-10-CM

## 2013-01-01 DIAGNOSIS — F411 Generalized anxiety disorder: Secondary | ICD-10-CM

## 2013-01-01 DIAGNOSIS — E785 Hyperlipidemia, unspecified: Secondary | ICD-10-CM

## 2013-01-01 DIAGNOSIS — R87619 Unspecified abnormal cytological findings in specimens from cervix uteri: Secondary | ICD-10-CM

## 2013-01-01 DIAGNOSIS — Z0000 Encounter for general adult medical examination without abnormal findings: Secondary | ICD-10-CM

## 2013-01-01 DIAGNOSIS — I10 Essential (primary) hypertension: Secondary | ICD-10-CM

## 2013-01-01 DIAGNOSIS — IMO0002 Reserved for concepts with insufficient information to code with codable children: Secondary | ICD-10-CM

## 2013-01-01 DIAGNOSIS — Z780 Asymptomatic menopausal state: Secondary | ICD-10-CM

## 2013-01-01 DIAGNOSIS — R7989 Other specified abnormal findings of blood chemistry: Secondary | ICD-10-CM

## 2013-01-01 DIAGNOSIS — Z1231 Encounter for screening mammogram for malignant neoplasm of breast: Secondary | ICD-10-CM | POA: Insufficient documentation

## 2013-01-01 LAB — COMPREHENSIVE METABOLIC PANEL
ALT: 28 U/L (ref 0–35)
Alkaline Phosphatase: 47 U/L (ref 39–117)
CO2: 27 mEq/L (ref 19–32)
Creat: 0.94 mg/dL (ref 0.50–1.10)
Sodium: 140 mEq/L (ref 135–145)
Total Bilirubin: 0.6 mg/dL (ref 0.3–1.2)
Total Protein: 7 g/dL (ref 6.0–8.3)

## 2013-01-01 LAB — CBC WITH DIFFERENTIAL/PLATELET
Basophils Relative: 1 % (ref 0–1)
Eosinophils Absolute: 0.2 10*3/uL (ref 0.0–0.7)
Eosinophils Relative: 4 % (ref 0–5)
MCH: 28.4 pg (ref 26.0–34.0)
MCHC: 34.6 g/dL (ref 30.0–36.0)
MCV: 82 fL (ref 78.0–100.0)
Neutrophils Relative %: 54 % (ref 43–77)
Platelets: 215 10*3/uL (ref 150–400)
RDW: 13.2 % (ref 11.5–15.5)

## 2013-01-01 LAB — LIPID PANEL
Cholesterol: 163 mg/dL (ref 0–200)
LDL Cholesterol: 80 mg/dL (ref 0–99)
Total CHOL/HDL Ratio: 2.6 Ratio
Triglycerides: 101 mg/dL (ref <150)
VLDL: 20 mg/dL (ref 0–40)

## 2013-01-01 MED ORDER — ATORVASTATIN CALCIUM 10 MG PO TABS: 10.0000 mg | ORAL_TABLET | Freq: Every day | ORAL | Status: DC

## 2013-01-01 MED ORDER — LISINOPRIL-HYDROCHLOROTHIAZIDE 20-25 MG PO TABS: 1.0000 | ORAL_TABLET | Freq: Every day | ORAL | Status: DC

## 2013-01-01 NOTE — Progress Notes (Signed)
Subjective:    Patient ID: [name], female    DOB: 08/22/1951, 62 y.o.   MRN: [id number]  HPI [name] is her for CPE.  She is doing OK since the death of her husband from brain cancer who died in 09-06-22.  She has not gone to [facility] grief counseling but she does not feel she needs it at this point.  She had a daughter and son in law in [place] and was able to see lots of family for mothers day.  She is sleeping OK and able to concentrate at work.  She works in her home  She is not sure if she has ever had chicken pox so had not taken Zostavax as yet.     She had her colonoscopy  In May 2013.    See BP  Pt states when she checks her BP at hom SBP below 140  She is asymptomatic  Allergies  Allergen Reactions  . Sulfonamide Derivatives Other (See Comments)    redness and swelling   Past Medical History  Diagnosis Date  . Hypertension   . Hyperlipidemia   . Allergic rhinitis   . Migraine   . Menopause   . Meniscus tear    Past Surgical History  Procedure Laterality Date  . Tonsillectomy    . Breast enhancement surgery  1978    replacement 1997   History   Social History  . Marital Status: Married    Spouse Name: N/A    Number of Children: N/A  . Years of Education: N/A   Occupational History  . [employer] software    Social History Main Topics  . Smoking status: Former Smoker    Quit date: 08/23/1980  . Smokeless tobacco: Never Used  . Alcohol Use: 0.6 oz/week    1 Glasses of wine per week  . Drug Use: No  . Sexually Active: No   Other Topics Concern  . Not on file   Social History Narrative  . No narrative on file   Family History  Problem Relation Age of Onset  . Ovarian cancer    . Stroke Maternal Grandmother   . Diabetes Maternal Grandmother   . Ovarian cancer Mother     metastatic  . Diabetes Father   . Hypertension Father    Patient Active Problem List   Diagnosis Date Noted  . Anxiety 08/02/2012  . Situational anxiety 07/05/2012  .  Elevated liver function tests 04/03/2012  . Abnormal Pap smear 09/30/2011  . DJD (degenerative joint disease) 09/30/2011  . Migraine 09/30/2011  . SINUSITIS - ACUTE-NOS 11/20/2009  . HYPERLIPIDEMIA 09/09/2009  . FOOT PAIN, RIGHT 06/09/2009  . COUGH 11/01/2008  . POSTMENOPAUSAL STATUS 07/30/2008  . HYPERTENSION 09/27/2007  . ALLERGIC RHINITIS 09/27/2007  . HOT FLASHES 09/27/2007  . FASCIITIS, PLANTAR 09/27/2007   Current Outpatient Prescriptions on File Prior to Visit  Medication Sig Dispense Refill  . aspirin 81 MG tablet Take 81 mg by mouth daily.        Marland Kitchen b complex vitamins tablet Take 1 tablet by mouth daily.      . cetirizine (ZYRTEC ALLERGY) 10 MG tablet Take 10 mg by mouth as needed.        . fish oil-omega-3 fatty acids 1000 MG capsule Take 1 g by mouth daily.        Marland Kitchen glucosamine-chondroitin 500-400 MG tablet Take 1 tablet by mouth daily.       Marland Kitchen LORazepam (ATIVAN) 0.5 MG tablet Take 1  tablet (0.5 mg total) by mouth 2 (two) times daily as needed for anxiety.  60 tablet  1  . Multiple Vitamin (MULTIVITAMIN) tablet Take 1 tablet by mouth daily.        Marland Kitchen VITAMIN C, CALCIUM ASCORBATE, PO Take 1 tablet by mouth daily.      . VENTOLIN HFA 108 (90 BASE) MCG/ACT inhaler Inhale 2 puffs into the lungs 4 (four) times daily as needed.        No current facility-administered medications on file prior to visit.       Review of Systems See HPI    Objective:   Physical Exam Physical Exam  Nursing note and vitals reviewed.  Constitutional: She is oriented to person, place, and time. She appears well-developed and well-nourished.  HENT:  Head: Normocephalic and atraumatic.  Cardiovascular: Normal rate and regular rhythm. Exam reveals no gallop and no friction rub.  No murmur heard.  Pulmonary/Chest: Breath sounds normal. She has no wheezes. She has no rales.  Neurological: She is alert and oriented to person, place, and time. Ext no edema  Skin: Skin is warm and dry.   Psychiatric: She has a normal mood and affect. Her behavior is normal.             Assessment & Plan:  Health maintenance:  Will check varicella titer as Zostavax is a live virus    MM and labs today   HTN:   BP did decrease upon recheckm  May be an element of White coat syndrom.   Continue meds   Hyperlipidemia  cotinue Lipitor will check today   Elevated LFTS  Like due to  lipitor will recheck today  DJD  On glucosamine  Bereavement  Improving.   Ativan prn  Allergic rhinitis  Continue meds  History of remote abnomal pap.  Pt states normal pap upon second repeat.  2013 normal  Ok to extend pap for 2-3 years

## 2013-01-01 NOTE — Patient Instructions (Addendum)
See me as needed 

## 2013-01-02 LAB — VITAMIN D 25 HYDROXY (VIT D DEFICIENCY, FRACTURES): Vit D, 25-Hydroxy: 33 ng/mL (ref 30–89)

## 2013-01-02 LAB — POCT URINALYSIS DIPSTICK
Bilirubin, UA: NEGATIVE
Blood, UA: NEGATIVE
Ketones, UA: NEGATIVE
Protein, UA: NEGATIVE
pH, UA: 6

## 2013-01-02 NOTE — Addendum Note (Signed)
Addended by: Mathews Robinsons on: 01/02/2013 08:22 AM   Modules accepted: Orders

## 2013-01-03 ENCOUNTER — Telehealth: Payer: Self-pay | Admitting: Internal Medicine

## 2013-01-03 ENCOUNTER — Encounter: Payer: Self-pay | Admitting: *Deleted

## 2013-01-03 ENCOUNTER — Telehealth: Payer: Self-pay | Admitting: *Deleted

## 2013-01-03 DIAGNOSIS — R7989 Other specified abnormal findings of blood chemistry: Secondary | ICD-10-CM

## 2013-01-03 NOTE — Telephone Encounter (Signed)
Message copied by Mathews Robinsons on Wed Jan 03, 2013  4:42 PM ------      Message from: Raechel Chute D      Created: Wed Jan 03, 2013  3:28 PM       Karen Kitchens            Call pt and tell her it is OK to get her Zostavax vaccine.  She can get it when she comes in for her ultrasound if she wants            Ask heather what day her ultrasound is scheduled for so you can co-ordinate ------

## 2013-01-03 NOTE — Telephone Encounter (Signed)
Called and spoke to [place]. Gave her appointment date and time.  Also gave her instructions NPO 6 hours prior to exam.

## 2013-01-03 NOTE — Telephone Encounter (Signed)
Will order zostavax May 22

## 2013-01-03 NOTE — Telephone Encounter (Signed)
Spoke with pt and notified of liver enzyme results.  Will get liver ultrasound.

## 2013-01-03 NOTE — Telephone Encounter (Signed)
Message copied by Mathews Robinsons on Wed Jan 03, 2013  4:39 PM ------      Message from: Raechel Chute D      Created: Wed Jan 03, 2013  3:28 PM       [name]            Call pt and tell her it is OK to get her Zostavax vaccine.  She can get it when she comes in for her ultrasound if she wants            Ask heather what day her ultrasound is scheduled for so you can co-ordinate ------

## 2013-01-11 ENCOUNTER — Other Ambulatory Visit: Payer: Self-pay | Admitting: *Deleted

## 2013-01-11 DIAGNOSIS — Z23 Encounter for immunization: Secondary | ICD-10-CM

## 2013-01-11 MED ORDER — ZOSTER VACCINE LIVE 19400 UNT/0.65ML ~~LOC~~ SOLR: 0.6500 mL | Freq: Once | SUBCUTANEOUS | Status: DC

## 2013-01-12 ENCOUNTER — Other Ambulatory Visit (HOSPITAL_BASED_OUTPATIENT_CLINIC_OR_DEPARTMENT_OTHER): Payer: BC Managed Care – PPO

## 2013-01-12 ENCOUNTER — Ambulatory Visit (HOSPITAL_BASED_OUTPATIENT_CLINIC_OR_DEPARTMENT_OTHER)
Admission: RE | Admit: 2013-01-12 | Discharge: 2013-01-12 | Disposition: A | Discharge status: Home / Self Care | Payer: BC Managed Care – PPO | Source: Ambulatory Visit | Attending: Internal Medicine | Admitting: Internal Medicine

## 2013-01-12 DIAGNOSIS — R7989 Other specified abnormal findings of blood chemistry: Secondary | ICD-10-CM

## 2013-01-12 DIAGNOSIS — K838 Other specified diseases of biliary tract: Secondary | ICD-10-CM | POA: Insufficient documentation

## 2013-01-12 DIAGNOSIS — E7800 Pure hypercholesterolemia, unspecified: Secondary | ICD-10-CM | POA: Insufficient documentation

## 2013-01-12 DIAGNOSIS — K8020 Calculus of gallbladder without cholecystitis without obstruction: Secondary | ICD-10-CM | POA: Insufficient documentation

## 2013-01-12 DIAGNOSIS — K7689 Other specified diseases of liver: Secondary | ICD-10-CM | POA: Insufficient documentation

## 2013-01-12 DIAGNOSIS — R945 Abnormal results of liver function studies: Secondary | ICD-10-CM | POA: Insufficient documentation

## 2013-01-12 DIAGNOSIS — I10 Essential (primary) hypertension: Secondary | ICD-10-CM | POA: Insufficient documentation

## 2013-01-17 ENCOUNTER — Telehealth: Payer: Self-pay | Admitting: Internal Medicine

## 2013-01-17 DIAGNOSIS — K8020 Calculus of gallbladder without cholecystitis without obstruction: Secondary | ICD-10-CM

## 2013-01-17 NOTE — Telephone Encounter (Signed)
Spoke with pt and informed of Ultrasound results.  Will get surgical opinion regarding gallstones

## 2013-01-18 ENCOUNTER — Telehealth: Payer: Self-pay | Admitting: Internal Medicine

## 2013-01-18 NOTE — Telephone Encounter (Signed)
Pt scheduled with Dr Jamey Ripa on Friday June 6,2014 at 11:00 am.  Pt made aware of appt, date, time, & location by phone.[name]

## 2013-01-26 ENCOUNTER — Ambulatory Visit (INDEPENDENT_AMBULATORY_CARE_PROVIDER_SITE_OTHER): Payer: BC Managed Care – PPO | Admitting: Surgery

## 2013-01-26 ENCOUNTER — Encounter (INDEPENDENT_AMBULATORY_CARE_PROVIDER_SITE_OTHER): Payer: Self-pay | Admitting: Surgery

## 2013-01-26 VITALS — BP 130/74 | HR 68 | Temp 98.0°F | Resp 18 | Ht 63.0 in | Wt 177.8 lb

## 2013-01-26 DIAGNOSIS — K8020 Calculus of gallbladder without cholecystitis without obstruction: Secondary | ICD-10-CM

## 2013-01-26 NOTE — Progress Notes (Signed)
NAME: [name] DOB: [birth year] MRN: [id number]                                                                                      DATE: 01/26/2013  PCP: Levon Hedger, MD Referring Provider: Kendrick Ranch, *  IMPRESSION:  Gallstones, basically asymptomatic  PLAN:   I have discussed the indications for laparoscopic cholecystectomy with her and provided educational material. We have discussed the risks of surgery, including general risks such as bleeding, infection, lung and heart issues etc. We have also discussed the potential for injuries to other organs, bile duct leaks, and other unexpected events. We have also talked about the fact that this may need to be converted to open under certain circumstances. We discussed the typical post op recovery and the fact that there is a good likelihood of improvement in symptoms and return to normal activity. At this point she is essentially asymptomatic. She like to defer surgery until she becomes more symptomatic. Therefore she will call us and make another appointment if she develops more problems.                    CC:  Chief Complaint  Patient presents with  . Cholelithiasis    HPI:  [name] is a 62 y.o.  female who presents for evaluation of gallstones. She recently had a complete physical examination and there was a slight elevations in LFTs. A gallbladder ultrasound was done which showed some gallstones but no evidence of acute inflammation. She really does not have any fatty food intolerance or other indigestion. She very rarely gets some heartburn but only if she eats chocolate or too high sugar content.she is never awakened at night with abdominal pain. She is planning to have knee surgery in about a week.  PMH:  has a past medical history of Hypertension; Hyperlipidemia; Allergic rhinitis; Migraine; Menopause; and Meniscus tear.  PSH:   has past surgical history that includes Tonsillectomy and Breast  enhancement surgery (1978).  ALLERGIES:   Allergies  Allergen Reactions  . Sulfonamide Derivatives Other (See Comments)    redness and swelling    MEDICATIONS: Current outpatient prescriptions:atorvastatin (LIPITOR) 10 MG tablet, Take 1 tablet (10 mg total) by mouth daily., Disp: 90 tablet, Rfl: 0;  b complex vitamins tablet, Take 1 tablet by mouth daily., Disp: , Rfl: ;  cetirizine (ZYRTEC ALLERGY) 10 MG tablet, Take 10 mg by mouth as needed.  , Disp: , Rfl: ;  fish oil-omega-3 fatty acids 1000 MG capsule, Take 1 g by mouth daily.  , Disp: , Rfl:  glucosamine-chondroitin 500-400 MG tablet, Take 1 tablet by mouth daily. , Disp: , Rfl: ;  ibuprofen (ADVIL,MOTRIN) 600 MG tablet, Take 600 mg by mouth every 8 (eight) hours as needed for pain., Disp: , Rfl: ;  lisinopril-hydrochlorothiazide (PRINZIDE,ZESTORETIC) 20-25 MG per tablet, Take 1 tablet by mouth daily., Disp: 90 tablet, Rfl: 1 LORazepam (ATIVAN) 0.5 MG tablet, Take 1 tablet (0.5 mg total) by mouth 2 (two) times daily as needed for anxiety., Disp: 60 tablet, Rfl: 1;  Melatonin 10 MG CAPS, Take 10 mg by mouth. For sleep, Disp: ,  Rfl: ;  Multiple Vitamin (MULTIVITAMIN) tablet, Take 1 tablet by mouth daily.  , Disp: , Rfl: ;  VENTOLIN HFA 108 (90 BASE) MCG/ACT inhaler, Inhale 2 puffs into the lungs 4 (four) times daily as needed. , Disp: , Rfl:  VITAMIN C, CALCIUM ASCORBATE, PO, Take 1 tablet by mouth daily., Disp: , Rfl: ;  aspirin 81 MG tablet, Take 81 mg by mouth daily.  , Disp: , Rfl: ;  zoster vaccine live, PF, (ZOSTAVAX) 84132 UNT/0.65ML injection, Inject 19,400 Units into the skin once., Disp: 1 each, Rfl: 0  ROS: She has filled out our 12 point review of systems and it is negative . EXAM:   VITAL SIGNS:  BP 130/74  Pulse 68  Temp(Src) 98 F (36.7 C)  Resp 18  Ht 5\' 3"  (1.6 m)  Wt 177 lb 12.8 oz (80.65 kg)  BMI 31.5 kg/m2  GENERAL:  The patient is alert, oriented, and generally healthy-appearing, NAD. Mood and affect are  normal.  HEENT:  The head is normocephalic, the eyes nonicteric, the pupils were round regular and equal. EOMs are normal. Pharynx normal. Dentition good.  NECK:  The neck is supple and there are no masses or thyromegaly.  LUNGS: Normal respirations and clear to auscultation.  HEART: Regular rhythm, with no murmurs rubs or gallops. Pulses are intact carotid dorsalis pedis and posterior tibial. No significant varicosities are noted.  ABDOMEN: Soft, flat, and nontender. No masses or organomegaly is noted. No hernias are noted. Bowel sounds are normal.  EXTREMITIES:  Good range of motion, no edema.   DATA REVIEWED:  I have reviewed over the ultrasound reports, laboratory studies, and physician progress notes all of which are in the electronic medical record    Magdalyn Arenivas J 01/26/2013  CC: Schoenhoff, Lanney Gins, MD

## 2013-01-26 NOTE — Patient Instructions (Signed)
If you have more symptoms that you think might be from your gallstones come back to see me and we will discuss surgery again

## 2013-03-28 ENCOUNTER — Other Ambulatory Visit: Payer: Self-pay | Admitting: Internal Medicine

## 2013-04-04 ENCOUNTER — Other Ambulatory Visit: Payer: Self-pay | Admitting: Internal Medicine

## 2013-04-04 ENCOUNTER — Other Ambulatory Visit: Payer: Self-pay | Admitting: *Deleted

## 2013-04-04 MED ORDER — LORAZEPAM 0.5 MG PO TABS: 0.5000 mg | ORAL_TABLET | Freq: Two times a day (BID) | ORAL | Status: DC | PRN

## 2013-04-04 NOTE — Telephone Encounter (Signed)
Ok for short course of Ativan  Inquire if she is moving locally or out of state  Let her know we will call her back about shingles vaccine

## 2013-04-04 NOTE — Telephone Encounter (Signed)
[name] called and would like to let us know that she is requesting a refill for her [name] due to needing it while selling her house and moving.  She also wanted to let us know that since she has met her deductible; she is now filling her shingles vaccine.  She will call to let us know when she will be coming by to get injection.

## 2013-04-04 NOTE — Telephone Encounter (Signed)
Refill request will call in pending approval 

## 2013-04-05 NOTE — Telephone Encounter (Signed)
Pt will be moving to [place] and will continue to use [place] as her PCP

## 2013-04-05 NOTE — Telephone Encounter (Signed)
Ativan called in to [facility] pharmacy

## 2013-06-25 ENCOUNTER — Other Ambulatory Visit: Payer: Self-pay | Admitting: Internal Medicine

## 2013-06-25 NOTE — Telephone Encounter (Signed)
Refill request

## 2013-06-28 ENCOUNTER — Other Ambulatory Visit: Payer: Self-pay

## 2013-08-03 ENCOUNTER — Ambulatory Visit (INDEPENDENT_AMBULATORY_CARE_PROVIDER_SITE_OTHER): Payer: BC Managed Care – PPO | Admitting: *Deleted

## 2013-08-03 DIAGNOSIS — Z23 Encounter for immunization: Secondary | ICD-10-CM

## 2013-08-03 MED ORDER — VARICELLA VIRUS VACCINE LIVE 1350 PFU/0.5ML IJ SUSR: 0.5000 mL | Freq: Once | INTRAMUSCULAR | Status: DC

## 2013-08-03 NOTE — Progress Notes (Signed)
Patient ID: [name], female   DOB: Apr 25, 1951, 62 y.o.   MRN: [id number] Rya is here for her shingles vaccination. She is feeling well with no fevers. VIS given to pt

## 2013-08-03 NOTE — Addendum Note (Signed)
Addended by: Perry Mount L on: 08/03/2013 11:09 AM   Modules accepted: Level of Service

## 2013-08-26 ENCOUNTER — Other Ambulatory Visit: Payer: Self-pay | Admitting: Internal Medicine

## 2013-08-27 NOTE — Telephone Encounter (Signed)
Refill request

## 2013-11-12 ENCOUNTER — Other Ambulatory Visit: Payer: Self-pay | Admitting: Internal Medicine

## 2013-11-12 NOTE — Telephone Encounter (Signed)
Refill request

## 2014-01-21 ENCOUNTER — Other Ambulatory Visit: Payer: Self-pay | Admitting: Internal Medicine

## 2014-01-21 NOTE — Telephone Encounter (Signed)
Refill request

## 2014-01-22 NOTE — Telephone Encounter (Signed)
[name]  Call pt and let her know that I have not seen her in one year.  I refilled her lipitor for 30 days only.  I need to see her in office once a year before I will refill this med again.  Give appt in the next 30 days.

## 2014-01-22 NOTE — Telephone Encounter (Signed)
Notified pt of medication refill She has an appt on 06/24

## 2014-02-13 ENCOUNTER — Encounter: Payer: Self-pay | Admitting: Internal Medicine

## 2014-02-13 ENCOUNTER — Ambulatory Visit (INDEPENDENT_AMBULATORY_CARE_PROVIDER_SITE_OTHER): Payer: 59 | Admitting: Internal Medicine

## 2014-02-13 VITALS — BP 136/80 | HR 88 | Resp 16 | Ht 63.0 in | Wt 181.0 lb

## 2014-02-13 DIAGNOSIS — Z0000 Encounter for general adult medical examination without abnormal findings: Secondary | ICD-10-CM

## 2014-02-13 DIAGNOSIS — R7989 Other specified abnormal findings of blood chemistry: Secondary | ICD-10-CM

## 2014-02-13 DIAGNOSIS — E785 Hyperlipidemia, unspecified: Secondary | ICD-10-CM

## 2014-02-13 DIAGNOSIS — Z1211 Encounter for screening for malignant neoplasm of colon: Secondary | ICD-10-CM

## 2014-02-13 DIAGNOSIS — I10 Essential (primary) hypertension: Secondary | ICD-10-CM

## 2014-02-13 DIAGNOSIS — J309 Allergic rhinitis, unspecified: Secondary | ICD-10-CM

## 2014-02-13 DIAGNOSIS — R945 Abnormal results of liver function studies: Secondary | ICD-10-CM

## 2014-02-13 LAB — COMPREHENSIVE METABOLIC PANEL
ALK PHOS: 50 U/L (ref 39–117)
ALT: 25 U/L (ref 0–35)
AST: 43 U/L — AB (ref 0–37)
Albumin: 4.3 g/dL (ref 3.5–5.2)
BILIRUBIN TOTAL: 0.4 mg/dL (ref 0.2–1.2)
BUN: 25 mg/dL — AB (ref 6–23)
CALCIUM: 9.5 mg/dL (ref 8.4–10.5)
CO2: 29 mEq/L (ref 19–32)
CREATININE: 1.2 mg/dL — AB (ref 0.50–1.10)
Chloride: 104 mEq/L (ref 96–112)
Glucose, Bld: 105 mg/dL — ABNORMAL HIGH (ref 70–99)
Potassium: 3.9 mEq/L (ref 3.5–5.3)
Sodium: 142 mEq/L (ref 135–145)
Total Protein: 6.5 g/dL (ref 6.0–8.3)

## 2014-02-13 LAB — POCT URINALYSIS DIPSTICK
Bilirubin, UA: NEGATIVE
Glucose, UA: NEGATIVE
Ketones, UA: NEGATIVE
Leukocytes, UA: NEGATIVE
NITRITE UA: NEGATIVE
PROTEIN UA: NEGATIVE
RBC UA: NEGATIVE
SPEC GRAV UA: 1.015
UROBILINOGEN UA: 0.2
pH, UA: 6

## 2014-02-13 LAB — CBC WITH DIFFERENTIAL/PLATELET
BASOS ABS: 0.1 10*3/uL (ref 0.0–0.1)
BASOS PCT: 1 % (ref 0–1)
EOS PCT: 3 % (ref 0–5)
Eosinophils Absolute: 0.2 10*3/uL (ref 0.0–0.7)
HCT: 36.9 % (ref 36.0–46.0)
Hemoglobin: 12.5 g/dL (ref 12.0–15.0)
Lymphocytes Relative: 25 % (ref 12–46)
Lymphs Abs: 1.9 10*3/uL (ref 0.7–4.0)
MCH: 28.7 pg (ref 26.0–34.0)
MCHC: 33.9 g/dL (ref 30.0–36.0)
MCV: 84.8 fL (ref 78.0–100.0)
Monocytes Absolute: 0.6 10*3/uL (ref 0.1–1.0)
Monocytes Relative: 8 % (ref 3–12)
Neutro Abs: 4.7 10*3/uL (ref 1.7–7.7)
Neutrophils Relative %: 63 % (ref 43–77)
Platelets: 243 10*3/uL (ref 150–400)
RBC: 4.35 MIL/uL (ref 3.87–5.11)
RDW: 12.5 % (ref 11.5–15.5)
WBC: 7.4 10*3/uL (ref 4.0–10.5)

## 2014-02-13 LAB — LIPID PANEL
Cholesterol: 151 mg/dL (ref 0–200)
HDL: 49 mg/dL (ref >39)
LDL CALC: 60 mg/dL (ref 0–99)
TRIGLYCERIDES: 210 mg/dL — AB (ref <150)
Total CHOL/HDL Ratio: 3.1 Ratio
VLDL: 42 mg/dL — AB (ref 0–40)

## 2014-02-13 LAB — TSH: TSH: 1.259 u[IU]/mL (ref 0.350–4.500)

## 2014-02-14 LAB — VITAMIN D 25 HYDROXY (VIT D DEFICIENCY, FRACTURES): Vit D, 25-Hydroxy: 43 ng/mL (ref 30–89)

## 2014-02-17 ENCOUNTER — Encounter: Payer: Self-pay | Admitting: Internal Medicine

## 2014-02-17 NOTE — Patient Instructions (Signed)
See me in one year or prn 

## 2014-02-17 NOTE — Progress Notes (Signed)
Subjective:    Patient ID: [name], female    DOB: 1951-04-13, 63 y.o.   MRN: [id number]  HPI  Randa EvensJoanne is here for CPE.  She now lives near [place].    HM:  Pap done 2013  2013, 2009, and 2008 all negative.  She is due for mm and labs.  Colonoscopy 2013 normal  HTN no chest pain no SOB no LE edema  Hyperlipidemia  On low dose lipitor tolerating well  Elevated  AST:  Found to have fatty liver and gallstones on U/S  She did see Dr. Jamey RipaStreck and was asymptomatic at eval.  Pt opted  to wait for surgical removal.    She is also on a statin  Allergies  Allergen Reactions  . Sulfonamide Derivatives Other (See Comments)    redness and swelling   Past Medical History  Diagnosis Date  . Hypertension   . Hyperlipidemia   . Allergic rhinitis   . Migraine   . Menopause   . Meniscus tear    Past Surgical History  Procedure Laterality Date  . Tonsillectomy    . Breast enhancement surgery  1978    replacement 1997   History   Social History  . Marital Status: Widowed    Spouse Name: N/A    Number of Children: N/A  . Years of Education: N/A   Occupational History  . [employer] software    Social History Main Topics  . Smoking status: Former Smoker    Quit date: 08/23/1980  . Smokeless tobacco: Never Used  . Alcohol Use: 0.6 oz/week    1 Glasses of wine per week  . Drug Use: No  . Sexual Activity: No   Other Topics Concern  . Not on file   Social History Narrative  . No narrative on file   Family History  Problem Relation Age of Onset  . Ovarian cancer    . Stroke Maternal Grandmother   . Diabetes Maternal Grandmother   . Ovarian cancer Mother     metastatic  . Cancer Mother     ovarian ca  . Diabetes Father   . Hypertension Father   . Heart disease Father    Patient Active Problem List   Diagnosis Date Noted  . Gallstones 01/26/2013  . Anxiety 08/02/2012  . Situational anxiety 07/05/2012  . Elevated liver function tests 04/03/2012  . Abnormal Pap  smear 09/30/2011  . DJD (degenerative joint disease) 09/30/2011  . Migraine 09/30/2011  . SINUSITIS - ACUTE-NOS 11/20/2009  . HYPERLIPIDEMIA 09/09/2009  . FOOT PAIN, RIGHT 06/09/2009  . COUGH 11/01/2008  . POSTMENOPAUSAL STATUS 07/30/2008  . HYPERTENSION 09/27/2007  . ALLERGIC RHINITIS 09/27/2007  . HOT FLASHES 09/27/2007  . FASCIITIS, PLANTAR 09/27/2007   Current Outpatient Prescriptions on File Prior to Visit  Medication Sig Dispense Refill  . aspirin 81 MG tablet Take 81 mg by mouth daily.        Marland Kitchen. atorvastatin (LIPITOR) 10 MG tablet TAKE ONE TABLET BY MOUTH ONCE DAILY  30 tablet  0  . cetirizine (ZYRTEC ALLERGY) 10 MG tablet Take 10 mg by mouth as needed.        . fish oil-omega-3 fatty acids 1000 MG capsule Take 1 g by mouth daily.        Marland Kitchen. glucosamine-chondroitin 500-400 MG tablet Take 1 tablet by mouth daily.       Marland Kitchen. ibuprofen (ADVIL,MOTRIN) 600 MG tablet Take 600 mg by mouth every 8 (eight) hours as needed for  pain.      . lisinopril-hydrochlorothiazide (PRINZIDE,ZESTORETIC) 20-25 MG per tablet TAKE ONE TABLET BY MOUTH ONCE DAILY  90 tablet  2  . Melatonin 10 MG CAPS Take 10 mg by mouth. For sleep      . Multiple Vitamin (MULTIVITAMIN) tablet Take 1 tablet by mouth daily.        . VENTOLIN HFA 108 (90 BASE) MCG/ACT inhaler Inhale 2 puffs into the lungs 4 (four) times daily as needed.        No current facility-administered medications on file prior to visit.     Review of Systems See HPI    Objective:   Physical Exam  Physical Exam  Nursing note and vitals reviewed.  Constitutional: She is oriented to person, place, and time. She appears well-developed and well-nourished.  HENT:  Head: Normocephalic and atraumatic.  Right Ear: Tympanic membrane and ear canal normal. No drainage. Tympanic membrane is not injected and not erythematous.  Left Ear: Tympanic membrane and ear canal normal. No drainage. Tympanic membrane is not injected and not erythematous.  Nose: Nose  normal. Right sinus exhibits no maxillary sinus tenderness and no frontal sinus tenderness. Left sinus exhibits no maxillary sinus tenderness and no frontal sinus tenderness.  Mouth/Throat: Oropharynx is clear and moist. No oral lesions. No oropharyngeal exudate.  Eyes: Conjunctivae and EOM are normal. Pupils are equal, round, and reactive to light.  Neck: Normal range of motion. Neck supple. No JVD present. Carotid bruit is not present. No mass and no thyromegaly present.  Cardiovascular: Normal rate, regular rhythm, S1 normal, S2 normal and intact distal pulses. Exam reveals no gallop and no friction rub.  No murmur heard.  Pulses:  Carotid pulses are 2+ on the right side, and 2+ on the left side.  Dorsalis pedis pulses are 2+ on the right side, and 2+ on the left side.  No carotid bruit. No LE edema  Pulmonary/Chest: Breath sounds normal. She has no wheezes. She has no rales. She exhibits no tenderness.  Breast no discrete mass no nipple discharge no axillary adenopathy bilaterally Abdominal: Soft. Bowel sounds are normal. She exhibits no distension and no mass. There is no hepatosplenomegaly. There is no tenderness. There is no CVA tenderness.  Rectal no mass guaiac neg Musculoskeletal: Normal range of motion.  No active synovitis to joints.  Lymphadenopathy:  She has no cervical adenopathy.  She has no axillary adenopathy.  Right: No inguinal and no supraclavicular adenopathy present.  Left: No inguinal and no supraclavicular adenopathy present.  Neurological: She is alert and oriented to person, place, and time. She has normal strength and normal reflexes. She displays no tremor. No cranial nerve deficit or sensory deficit. Coordination and gait normal.  Skin: Skin is warm and dry. No rash noted. No cyanosis. Nails show no clubbing.  Psychiatric: She has a normal mood and affect. Her speech is normal and behavior is normal. Cognition and memory are normal.          Assessment &  Plan:  HM  Will schedule mm,  Labs today  Pap in 2016  Dexa age 63  See scanned sheet  UTD with vaccines  HTN   Continue meds  Hyperlipidemia  Will ccheck lipids, liver today continue Lipitor  Allergic rhinitis  Continue Zyrtec  Gallstones  Counseled if any pain upper abd , fever, N/V to go to ER    See me 1 year or prn

## 2014-02-19 ENCOUNTER — Telehealth: Payer: Self-pay | Admitting: *Deleted

## 2014-02-19 NOTE — Telephone Encounter (Signed)
Ashley CelesteJoanna called about referral to surgeon in [name] area

## 2014-02-19 NOTE — Telephone Encounter (Signed)
Heather   Tell Randa EvensJoanne that I have asked colleagues and no one can give a personal recommendation for a surgeon that they know well enough.  Advise her to ask friends for a reference to go to Angie's list and see who has good reviews

## 2014-03-31 ENCOUNTER — Other Ambulatory Visit: Payer: Self-pay | Admitting: Internal Medicine

## 2014-04-01 NOTE — Telephone Encounter (Signed)
[name], [name] - 03/31/2014 7:29 AM ','<More Detail >>       Rx Auth Request    Surescripts Out Interface        Sent: Sun March 31, 2014 7:29 AM    To: [facility]                   Message     ----- Message from [facility] Paurescripts Out Interface sent at 03/31/2014 7:29 AM -----         The demographic information from the pharmacy is:    Patient Name: [name]    Patient DOB: March 20, 1951    Patient Gender: Female    Address:    [phone]    [facility]    [place]    [phone]             [name] MRN: [id number] Home: [id number]  63 y.o. / Female (March 20, 1951) PCP: Kendrick Rancheborah D Schoenhoff, MD Work: Not available  [facility] [place], [place] - [address] [place] [address] Ph: [phone] Wt: 181 lb (82.101 kg) (02/13/2014) Mobile: [phone]          Guarantor Account: [name], [name] ([id number])     Relation to Patient: Account Type Service Area    Self Personal/Family [place] SERVICE AREA            Coverages for This Account     Coverage ID Payor Plan Insurance ID     [id number] [facility] [place] [id number]             Guarantor Account: [name], [name] ([id number])     Relation to Patient: Account Type Service Area    Self Personal/Family [employer]            Coverages for This Account     Coverage ID Payor Plan Insurance ID     [id number] [employer]UE [place] [id number]             Guarantor Account: [name], [name] ([id number])     Relation to Patient: Account Type Service Area    Self Personal/Family [facility] SERVICE ARE[facility]            Coverages for This Account     Coverage ID Payor Plan Insurance ID     [id number] BLUE [place]OSS BLUE [place] [id number]             Guarantor Account: [name], [name] ([id number])     Relation to Patient: Account Type Service Area    Self  Personal/Family [facility]                   Requested Medications     Medication name:  Name from pharmacy:  atorvastatin (LIPITOR) 10 MG tablet  ATORVASTATIN 10MG  TAB    Sig: TAKE ONE TABLET BY MOUTH DAILY    Dispense: 30 tablet Refills: 0 Start: 03/31/2014  Class: Normal    Requested on: 01/22/2014    Originally ordered on: 12/24/2010 Last refill: 01/23/2014 Order History and Details              Call Documentation     No notes of this type exist for this encounter.             Contacts       Type Contact  Phone    03/31/2014 7:29 AM Interface (Incoming) [facility] [phone]              Allergies as of 03/31/2014 Review Complete On: 02/13/2014 By: Arne Cleveland, CMA     Allergen Noted Type Reactions    Sulfonamide Derivatives 09/27/2007   Other (See Comments)    redness and swelling              Patient Flags     No FYI flags for this patient.                 Outstanding Procedures      Open Future Orders       Priority Expected Expires Ordered    MM DIGITAL SCREENING BILATERAL Routine  04/16/2015 02/13/2014           Normal Orders       Priority Ordered    Ambulatory referral to General Surgery Routine 01/17/2013    Comprehensive metabolic panel Routine 01/01/2013    Vitamin D 25 hydroxy Routine 01/01/2013    TSH Routine 01/01/2013    Lipid panel Routine 01/01/2013              Past Appointments     Date Time Status Provider Dept Type Appt Notes    02/13/2014 3:30 PM Comp Kendrick Ranch, MD [facility] CPE45 CPE     08/03/2013 11:00 AM Comp Lysbeth Penner, RN [facility] INJECTION shingles vaccine/ hb    01/26/2013 11:30 AM Comp Currie Paris, MD [facility] New Patient eval gallstones Dr Schoenhoff/notes in epic    01/12/2013 9:30 AM Comp [facility]Us 1 [facility] USABDOMENC     01/12/2013 2:30 PM Can [facility]Us 1 [facility] USABDOMENC     01/01/2013 9:15 AM Comp [facility]-Mm 1  [facility]-MM MMSCREENIMP     01/01/2013 9:00 AM Comp Kendrick Ranch, MD [facility] CPE45 cpe...ad     09/16/2012 9:15 AM Comp Vicenta Dunning, RN [facility] OV ILLNESS--EST PT--STOP    08/02/2012 3:30 PM Comp Kendrick Ranch, MD [facility] ESTPT15 refill meds    05/31/2012 3:30 PM Comp Kendrick Ranch, MD [facility] ESTPT15 cough              Pharmacy     [facility] [address] [place] - [address] [place]    [address] [place] [address]    Phone: [phone] Fax: [phone]    Open 24 Hours?: No

## 2014-04-01 NOTE — Telephone Encounter (Signed)
   Requested Medications     Medication name:  Name from pharmacy:  atorvastatin (LIPITOR) 10 MG tablet  ATORVASTATIN 10MG  TAB    Sig: TAKE ONE TABLET BY MOUTH DAILY    Dispense: 30 tablet Refills: 0 Start: 03/31/2014  Class: Normal    Requested on: 01/22/2014    Originally ordered on: 12/24/2010 Last refill: 01/23/2014 Order History and Details              Call

## 2014-05-10 ENCOUNTER — Ambulatory Visit (HOSPITAL_BASED_OUTPATIENT_CLINIC_OR_DEPARTMENT_OTHER): Payer: 59

## 2014-05-31 ENCOUNTER — Ambulatory Visit (HOSPITAL_BASED_OUTPATIENT_CLINIC_OR_DEPARTMENT_OTHER)
Admission: RE | Admit: 2014-05-31 | Discharge: 2014-05-31 | Disposition: A | Discharge status: Home / Self Care | Payer: 59 | Source: Ambulatory Visit | Attending: Internal Medicine | Admitting: Internal Medicine

## 2014-05-31 DIAGNOSIS — Z0000 Encounter for general adult medical examination without abnormal findings: Secondary | ICD-10-CM

## 2014-05-31 DIAGNOSIS — Z1231 Encounter for screening mammogram for malignant neoplasm of breast: Secondary | ICD-10-CM | POA: Diagnosis not present

## 2014-06-07 ENCOUNTER — Other Ambulatory Visit: Payer: Self-pay

## 2014-06-24 ENCOUNTER — Encounter: Payer: Self-pay | Admitting: Internal Medicine

## 2014-07-17 ENCOUNTER — Ambulatory Visit (HOSPITAL_BASED_OUTPATIENT_CLINIC_OR_DEPARTMENT_OTHER)
Admission: RE | Admit: 2014-07-17 | Discharge: 2014-07-17 | Disposition: A | Discharge status: Home / Self Care | Payer: 59 | Source: Ambulatory Visit | Attending: Internal Medicine | Admitting: Internal Medicine

## 2014-07-17 ENCOUNTER — Ambulatory Visit (INDEPENDENT_AMBULATORY_CARE_PROVIDER_SITE_OTHER): Payer: 59 | Admitting: Internal Medicine

## 2014-07-17 ENCOUNTER — Encounter: Payer: Self-pay | Admitting: Internal Medicine

## 2014-07-17 VITALS — BP 157/90 | HR 81 | Temp 98.5°F | Resp 16 | Ht 63.0 in | Wt 180.0 lb

## 2014-07-17 DIAGNOSIS — M19042 Primary osteoarthritis, left hand: Secondary | ICD-10-CM | POA: Diagnosis not present

## 2014-07-17 DIAGNOSIS — M79641 Pain in right hand: Secondary | ICD-10-CM

## 2014-07-17 DIAGNOSIS — M79642 Pain in left hand: Secondary | ICD-10-CM | POA: Insufficient documentation

## 2014-07-17 DIAGNOSIS — J40 Bronchitis, not specified as acute or chronic: Secondary | ICD-10-CM

## 2014-07-17 DIAGNOSIS — R05 Cough: Secondary | ICD-10-CM

## 2014-07-17 LAB — COMPREHENSIVE METABOLIC PANEL
ALT: 29 U/L (ref 0–35)
AST: 48 U/L — AB (ref 0–37)
Albumin: 4 g/dL (ref 3.5–5.2)
Alkaline Phosphatase: 56 U/L (ref 39–117)
BUN: 20 mg/dL (ref 6–23)
CALCIUM: 9.3 mg/dL (ref 8.4–10.5)
CO2: 25 meq/L (ref 19–32)
Chloride: 101 mEq/L (ref 96–112)
Creat: 0.77 mg/dL (ref 0.50–1.10)
Glucose, Bld: 91 mg/dL (ref 70–99)
POTASSIUM: 3.9 meq/L (ref 3.5–5.3)
SODIUM: 138 meq/L (ref 135–145)
Total Bilirubin: 0.4 mg/dL (ref 0.2–1.2)
Total Protein: 6.8 g/dL (ref 6.0–8.3)

## 2014-07-17 LAB — URIC ACID: Uric Acid, Serum: 5.1 mg/dL (ref 2.4–7.0)

## 2014-07-17 LAB — RHEUMATOID FACTOR

## 2014-07-17 MED ORDER — ACETAMINOPHEN-CODEINE #3 300-30 MG PO TABS: 1.0000 | ORAL_TABLET | ORAL | Status: DC | PRN

## 2014-07-17 MED ORDER — HYDROCOD POLST-CHLORPHEN POLST 10-8 MG/5ML PO LQCR: 5.0000 mL | Freq: Two times a day (BID) | ORAL | Status: DC | PRN

## 2014-07-17 MED ORDER — AZITHROMYCIN 250 MG PO TABS: ORAL_TABLET | ORAL | Status: DC

## 2014-07-17 NOTE — Patient Instructions (Signed)
Take glucosamine/chondroitin per directions  Call with rheumatologist name  To xray today

## 2014-07-17 NOTE — Progress Notes (Signed)
Subjective:    Patient ID: [name], female    DOB: Jun 13, 1951, 63 y.o.   MRN: [id number]  HPI 01/2014 note HM Will schedule mm, Labs today Pap in 2016 Dexa age 63 See scanned sheet UTD with vaccines  HTN Continue meds  Hyperlipidemia Will ccheck lipids, liver today continue Lipitor  Allergic rhinitis Continue Zyrtec  Gallstones Counseled if any pain upper abd , fever, N/V to go to ER   See me 1 year or prn  TODAY:  [name] is here for acute visit for 2 reasons  Swelling pain erythema of fingers and wrists   Worse over last 2 weeks.  Taking ibuprofen daily for many months.   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  No chest pain no fever  No SOB  Allergies  Allergen Reactions  . Sulfonamide Derivatives Other (See Comments)    redness and swelling   Past Medical History  Diagnosis Date  . Hypertension   . Hyperlipidemia   . Allergic rhinitis   . Migraine   . Menopause   . Meniscus tear    Past Surgical History  Procedure Laterality Date  . Tonsillectomy    . Breast enhancement surgery  1978    replacement 1997   History   Social History  . Marital Status: Widowed    Spouse Name: N/A    Number of Children: N/A  . Years of Education: N/A   Occupational History  . [employer]    Social History Main Topics  . Smoking status: Former Smoker    Quit date: 08/23/1980  . Smokeless tobacco: Never Used  . Alcohol Use: 0.6 oz/week    1 Glasses of wine per week  . Drug Use: No  . Sexual Activity: No   Other Topics Concern  . Not on file   Social History Narrative   Family History  Problem Relation Age of Onset  . Ovarian cancer    . Stroke Maternal Grandmother   . Diabetes Maternal Grandmother   . Ovarian cancer Mother     metastatic  . Cancer Mother     ovarian ca  . Diabetes Father   .  Hypertension Father   . Heart disease Father    Patient Active Problem List   Diagnosis Date Noted  . Gallstones 01/26/2013  . Anxiety 08/02/2012  . Situational anxiety 07/05/2012  . Elevated liver function tests 04/03/2012  . Abnormal Pap smear of cervix  2008,2009, 2013 all negative 09/30/2011  . DJD (degenerative joint disease) 09/30/2011  . Migraine 09/30/2011  . SINUSITIS - ACUTE-NOS 11/20/2009  . HYPERLIPIDEMIA 09/09/2009  . FOOT PAIN, RIGHT 06/09/2009  . COUGH 11/01/2008  . POSTMENOPAUSAL STATUS 07/30/2008  . HYPERTENSION 09/27/2007  . ALLERGIC RHINITIS 09/27/2007  . HOT FLASHES 09/27/2007  . FASCIITIS, PLANTAR 09/27/2007   Current Outpatient Prescriptions on File Prior to Visit  Medication Sig Dispense Refill  . aspirin 81 MG tablet Take 81 mg by mouth daily.      Marland Kitchen. atorvastatin (LIPITOR) 10 MG tablet TAKE ONE TABLET BY MOUTH DAILY 90 tablet 1  . cetirizine (ZYRTEC ALLERGY) 10 MG tablet Take 10 mg by mouth as needed.      . famotidine (PEPCID) 20 MG tablet Take 20 mg by mouth daily.    . fish oil-omega-3 fatty acids 1000 MG  capsule Take 1 g by mouth daily.      Marland Kitchen. glucosamine-chondroitin 500-400 MG tablet Take 1 tablet by mouth daily.     Marland Kitchen. ibuprofen (ADVIL,MOTRIN) 600 MG tablet Take 600 mg by mouth every 8 (eight) hours as needed for pain.    Marland Kitchen. lisinopril-hydrochlorothiazide (PRINZIDE,ZESTORETIC) 20-25 MG per tablet TAKE ONE TABLET BY MOUTH ONCE DAILY 90 tablet 2  . Melatonin 10 MG CAPS Take 10 mg by mouth. For sleep    . Multiple Vitamin (MULTIVITAMIN) tablet Take 1 tablet by mouth daily.      . VENTOLIN HFA 108 (90 BASE) MCG/ACT inhaler Inhale 2 puffs into the lungs 4 (four) times daily as needed.      No current facility-administered medications on file prior to visit.       Review of Systems    see HPI Objective:   Physical Exam   Physical Exam  Nursing note and vitals reviewed.  Constitutional: She is oriented to person, place, and time. She appears  well-developed and well-nourished.  HENT:  Head: Normocephalic and atraumatic.  Cardiovascular: Normal rate and regular rhythm. Exam reveals no gallop and no friction rub.  No murmur heard.  Pulmonary/Chest: Breath sounds normal. She has no wheezes. She has no rales.  Neurological: She is alert and oriented to person, place, and time.  Skin: Skin is warm and dry.  M/S  Edema with OA changes both hands Psychiatric: She has a normal mood and affect. Her behavior is normal.       Assessment & Plan:  OA:  Will get xrays  Ok for tylenol #3 to give NSAID holiday.  Will get xrays arthritis panel today  .  Will need referral to rheumatologist but [place] lives in [place] .  She wishes to find one there and will call me if she needs referral  Bronchitis  Z-pak to fill if she does not improve  Cough  Tussionex q 12h prn    See me if not better

## 2014-07-18 LAB — SEDIMENTATION RATE: Sed Rate: 20 mm/hr (ref 0–22)

## 2014-07-19 LAB — ANA: Anti Nuclear Antibody(ANA): NEGATIVE

## 2014-07-22 ENCOUNTER — Other Ambulatory Visit: Payer: Self-pay | Admitting: Internal Medicine

## 2014-07-22 ENCOUNTER — Encounter: Payer: Self-pay | Admitting: Internal Medicine

## 2014-07-22 NOTE — Telephone Encounter (Signed)
Gena FrayJoanne Krejci [phone]  Randa EvensJoanne called to say she does need a referral and she found Dr Charissa BashHeather Holden [phone]

## 2014-07-22 NOTE — Progress Notes (Signed)
[name] is aware of her lab results-eh

## 2014-07-22 NOTE — Telephone Encounter (Signed)
Refill request

## 2014-08-02 ENCOUNTER — Telehealth: Payer: Self-pay | Admitting: *Deleted

## 2014-08-02 NOTE — Telephone Encounter (Signed)
I called Dr. Herbert SetaHeather Holden's office and left a message in regards to making referral to her [facility].

## 2014-08-07 NOTE — Telephone Encounter (Signed)
Sent referral in to Dr Charissa BashHeather Holden and pt has appointment 10/09/14 at 8:50 am -

## 2014-09-19 ENCOUNTER — Ambulatory Visit (INDEPENDENT_AMBULATORY_CARE_PROVIDER_SITE_OTHER): Payer: 59 | Admitting: Internal Medicine

## 2014-09-19 ENCOUNTER — Encounter: Payer: Self-pay | Admitting: Internal Medicine

## 2014-09-19 VITALS — BP 184/79 | HR 89 | Temp 97.7°F | Resp 16 | Ht 63.0 in | Wt 176.0 lb

## 2014-09-19 DIAGNOSIS — R05 Cough: Secondary | ICD-10-CM

## 2014-09-19 DIAGNOSIS — T464X5A Adverse effect of angiotensin-converting-enzyme inhibitors, initial encounter: Secondary | ICD-10-CM

## 2014-09-19 DIAGNOSIS — H9313 Tinnitus, bilateral: Secondary | ICD-10-CM

## 2014-09-19 DIAGNOSIS — I10 Essential (primary) hypertension: Secondary | ICD-10-CM

## 2014-09-19 DIAGNOSIS — R058 Other specified cough: Secondary | ICD-10-CM

## 2014-09-19 MED ORDER — POTASSIUM CHLORIDE CRYS ER 20 MEQ PO TBCR: 20.0000 meq | EXTENDED_RELEASE_TABLET | Freq: Every day | ORAL | Status: DC

## 2014-09-19 MED ORDER — LOSARTAN POTASSIUM-HCTZ 100-25 MG PO TABS: 1.0000 | ORAL_TABLET | Freq: Every day | ORAL | Status: DC

## 2014-09-19 NOTE — Progress Notes (Signed)
Subjective:    Patient ID: [name], female    DOB: 1950-11-24, 64 y.o.   MRN: [id number]  HPI 01/2014 note HM Will schedule mm, Labs today Pap in 2016 Dexa age 64 See scanned sheet UTD with vaccines  HTN Continue meds  Hyperlipidemia Will ccheck lipids, liver today continue Lipitor  Allergic rhinitis Continue Zyrtec  Gallstones Counseled if any pain upper abd , fever, N/V to go to ER  See me 1 year or prn      TODAY  [name] is here for acute visit   [place] .  Persistant "nagging "  Cough  Minimally productve  . Cough has been present for over 2 months.   NO SOB, no chest pain no fever.   Decreased hearing and tinnitus for several weeks.  [facility] and given amoxicillin   HTN:  See BP  [name] brings recordings from home and they have ranged 140-154 systolic   diastolics normal at home  Allergies  Allergen Reactions  . Sulfonamide Derivatives Other (See Comments)    redness and swelling   Past Medical History  Diagnosis Date  . Hypertension   . Hyperlipidemia   . Allergic rhinitis   . Migraine   . Menopause   . Meniscus tear    Past Surgical History  Procedure Laterality Date  . Tonsillectomy    . Breast enhancement surgery  1978    replacement 1997   History   Social History  . Marital Status: Widowed    Spouse Name: N/A    Number of Children: N/A  . Years of Education: N/A   Occupational History  . [employer]    Social History Main Topics  . Smoking status: Former Smoker    Quit date: 08/23/1980  . Smokeless tobacco: Never Used  . Alcohol Use: 0.6 oz/week    1 Glasses of wine per week  . Drug Use: No  . Sexual Activity: No   Other Topics Concern  . Not on file   Social History Narrative   Family History  Problem Relation Age of Onset  . Ovarian cancer    . Stroke Maternal Grandmother   . Diabetes Maternal Grandmother   . Ovarian cancer Mother     metastatic  . Cancer Mother      ovarian ca  . Diabetes Father   . Hypertension Father   . Heart disease Father    Patient Active Problem List   Diagnosis Date Noted  . Gallstones 01/26/2013  . Anxiety 08/02/2012  . Situational anxiety 07/05/2012  . Elevated liver function tests 04/03/2012  . Abnormal Pap smear of cervix  2008,2009, 2013 all negative 09/30/2011  . DJD (degenerative joint disease) 09/30/2011  . Migraine 09/30/2011  . SINUSITIS - ACUTE-NOS 11/20/2009  . HYPERLIPIDEMIA 09/09/2009  . FOOT PAIN, RIGHT 06/09/2009  . COUGH 11/01/2008  . POSTMENOPAUSAL STATUS 07/30/2008  . HYPERTENSION 09/27/2007  . ALLERGIC RHINITIS 09/27/2007  . HOT FLASHES 09/27/2007  . FASCIITIS, PLANTAR 09/27/2007   Current Outpatient Prescriptions on File Prior to Visit  Medication Sig Dispense Refill  . acetaminophen-codeine (TYLENOL #3) 300-30 MG per tablet Take 1 tablet by mouth every 4 (four) hours as needed for moderate pain. 30 tablet 1  . aspirin 81 MG tablet Take 81 mg by mouth daily.      Marland Kitchen. atorvastatin (LIPITOR) 10 MG tablet TAKE ONE TABLET BY MOUTH DAILY 90 tablet 1  .  azithromycin (ZITHROMAX) 250 MG tablet Take as directed 6 tablet 0  . cetirizine (ZYRTEC ALLERGY) 10 MG tablet Take 10 mg by mouth as needed.      . chlorpheniramine-HYDROcodone (TUSSIONEX PENNKINETIC ER) 10-8 MG/5ML LQCR Take 5 mLs by mouth every 12 (twelve) hours as needed for cough. 120 mL 0  . famotidine (PEPCID) 20 MG tablet Take 20 mg by mouth daily.    . fish oil-omega-3 fatty acids 1000 MG capsule Take 1 g by mouth daily.      Marland Kitchen glucosamine-chondroitin 500-400 MG tablet Take 1 tablet by mouth daily.     Marland Kitchen ibuprofen (ADVIL,MOTRIN) 600 MG tablet Take 600 mg by mouth every 8 (eight) hours as needed for pain.    Marland Kitchen lisinopril-hydrochlorothiazide (PRINZIDE,ZESTORETIC) 20-25 MG per tablet TAKE ONE TABLET BY MOUTH ONCE DAILY 90 tablet 1  . Melatonin 10 MG CAPS Take 10 mg by mouth. For sleep    . Multiple Vitamin (MULTIVITAMIN) tablet Take 1 tablet by  mouth daily.      . VENTOLIN HFA 108 (90 BASE) MCG/ACT inhaler Inhale 2 puffs into the lungs 4 (four) times daily as needed.      No current facility-administered medications on file prior to visit.       Review of Systems    see HPI Objective:   Physical Exam Physical Exam  Nursing note and vitals reviewed.  Constitutional: She is oriented to person, place, and time. She appears well-developed and well-nourished.  HENT:  Head: Normocephalic and atraumatic. TM"s  Minimal cerumen  No effusion or exudate  Cardiovascular: Normal rate and regular rhythm. Exam reveals no gallop and no friction rub.  No murmur heard.  Pulmonary/Chest: Breath sounds normal. She has no wheezes. She has no rales.  Neurological: She is alert and oriented to person, place, and time.  Skin: Skin is warm and dry.  Psychiatric: She has a normal mood and affect. Her behavior is normal.       Assessment & Plan:  persistant chronic cough  :  May be due to ACE  Will D/C  HTN  Will change to Hyzaar100/25 with K-dur 20 meq daily  Decreased hearing /  Tinnitus:  Advised pt she needs ENT eval.  She will get name of ENT in [place]  If she needs referral from me she will call  See me in 4-6 weeks

## 2014-09-19 NOTE — Patient Instructions (Signed)
Stop lisinopril HCTZ  Start taking Hyzaar 100/25 every day with K-dur  (postassium pill) 20 meq daily    Call me with ENT MD if referral needed  See me in 3-4 weeks

## 2014-09-30 ENCOUNTER — Telehealth: Payer: Self-pay | Admitting: *Deleted

## 2014-09-30 ENCOUNTER — Telehealth: Payer: Self-pay | Admitting: Internal Medicine

## 2014-09-30 MED ORDER — LOSARTAN POTASSIUM 100 MG PO TABS: 100.0000 mg | ORAL_TABLET | Freq: Every day | ORAL | Status: DC

## 2014-09-30 NOTE — Telephone Encounter (Signed)
Santa GeneraElaine  Tell Merriam that I changed her BP med to just losartan  I need to see her in office in 8-10 weeks to check her BP   Given her an OV with me

## 2014-09-30 NOTE — Telephone Encounter (Signed)
I spoke with Randa EvensJoanne in regards to her BP medication

## 2014-09-30 NOTE — Telephone Encounter (Signed)
[name] is going to call back to make the F/U appointment after she finds a sitter for her grandchildren

## 2014-09-30 NOTE — Telephone Encounter (Signed)
Ashley EvensJoanne called and said she would like to change her losartan/HCTZ. She is requesting to just have the Losartan without the HCTZ. She says that she can not take the K daily because it is too hard to swallow and this why she wants to stop the HCTZ

## 2014-10-14 ENCOUNTER — Encounter: Payer: Self-pay | Admitting: Internal Medicine

## 2014-10-14 ENCOUNTER — Encounter: Payer: Self-pay | Admitting: *Deleted

## 2014-10-14 DIAGNOSIS — M1990 Unspecified osteoarthritis, unspecified site: Secondary | ICD-10-CM | POA: Insufficient documentation

## 2014-12-05 ENCOUNTER — Other Ambulatory Visit: Payer: Self-pay | Admitting: *Deleted

## 2014-12-05 MED ORDER — LOSARTAN POTASSIUM 100 MG PO TABS: 100.0000 mg | ORAL_TABLET | Freq: Every day | ORAL | Status: DC

## 2014-12-05 NOTE — Telephone Encounter (Signed)
refil lrequest- patient also requesting a refill on HCTZ and K however I do not see these on her current med list-eh

## 2014-12-17 ENCOUNTER — Other Ambulatory Visit: Payer: Self-pay | Admitting: *Deleted

## 2014-12-17 NOTE — Telephone Encounter (Signed)
Pt called in needs refills on meds until she can find new provider. See pending medications Pt also mentioned a refill on HCTZ but I didn't see that in her current med list. Please advise.   MH

## 2014-12-18 ENCOUNTER — Other Ambulatory Visit: Payer: Self-pay | Admitting: *Deleted

## 2014-12-18 MED ORDER — POTASSIUM CHLORIDE CRYS ER 20 MEQ PO TBCR: 40.0000 meq | EXTENDED_RELEASE_TABLET | Freq: Every day | ORAL | Status: DC

## 2014-12-18 MED ORDER — POTASSIUM CHLORIDE CRYS ER 20 MEQ PO TBCR: EXTENDED_RELEASE_TABLET | ORAL | Status: AC

## 2014-12-18 MED ORDER — LOSARTAN POTASSIUM 100 MG PO TABS: 100.0000 mg | ORAL_TABLET | Freq: Every day | ORAL | Status: DC

## 2014-12-18 MED ORDER — ATORVASTATIN CALCIUM 10 MG PO TABS: 10.0000 mg | ORAL_TABLET | Freq: Every day | ORAL | Status: AC

## 2014-12-23 ENCOUNTER — Other Ambulatory Visit: Payer: Self-pay | Admitting: *Deleted

## 2014-12-23 NOTE — Telephone Encounter (Signed)
Patient wanting the [place]/HCTZ refilled. She does not like it without the HCTZ. I did not see the HCTZ on her current med list. Please advise

## 2014-12-24 MED ORDER — LOSARTAN POTASSIUM 100 MG PO TABS: 100.0000 mg | ORAL_TABLET | Freq: Every day | ORAL | Status: AC

## 2015-07-24 ENCOUNTER — Encounter: Payer: Self-pay | Admitting: Internal Medicine

## 2015-11-03 IMAGING — CR DG HAND COMPLETE 3+V*L*
3 series · 3 of 3 positions shown · non-contrast
Comparison: None.

CLINICAL DATA: Bilateral hand pain in the palm are regions with
stiffness and pain within the fingers; history of bilateral carpal
tunnel surgery; no history of trauma

EXAM:
LEFT HAND - COMPLETE 3+ VIEW

[x hand pa left]
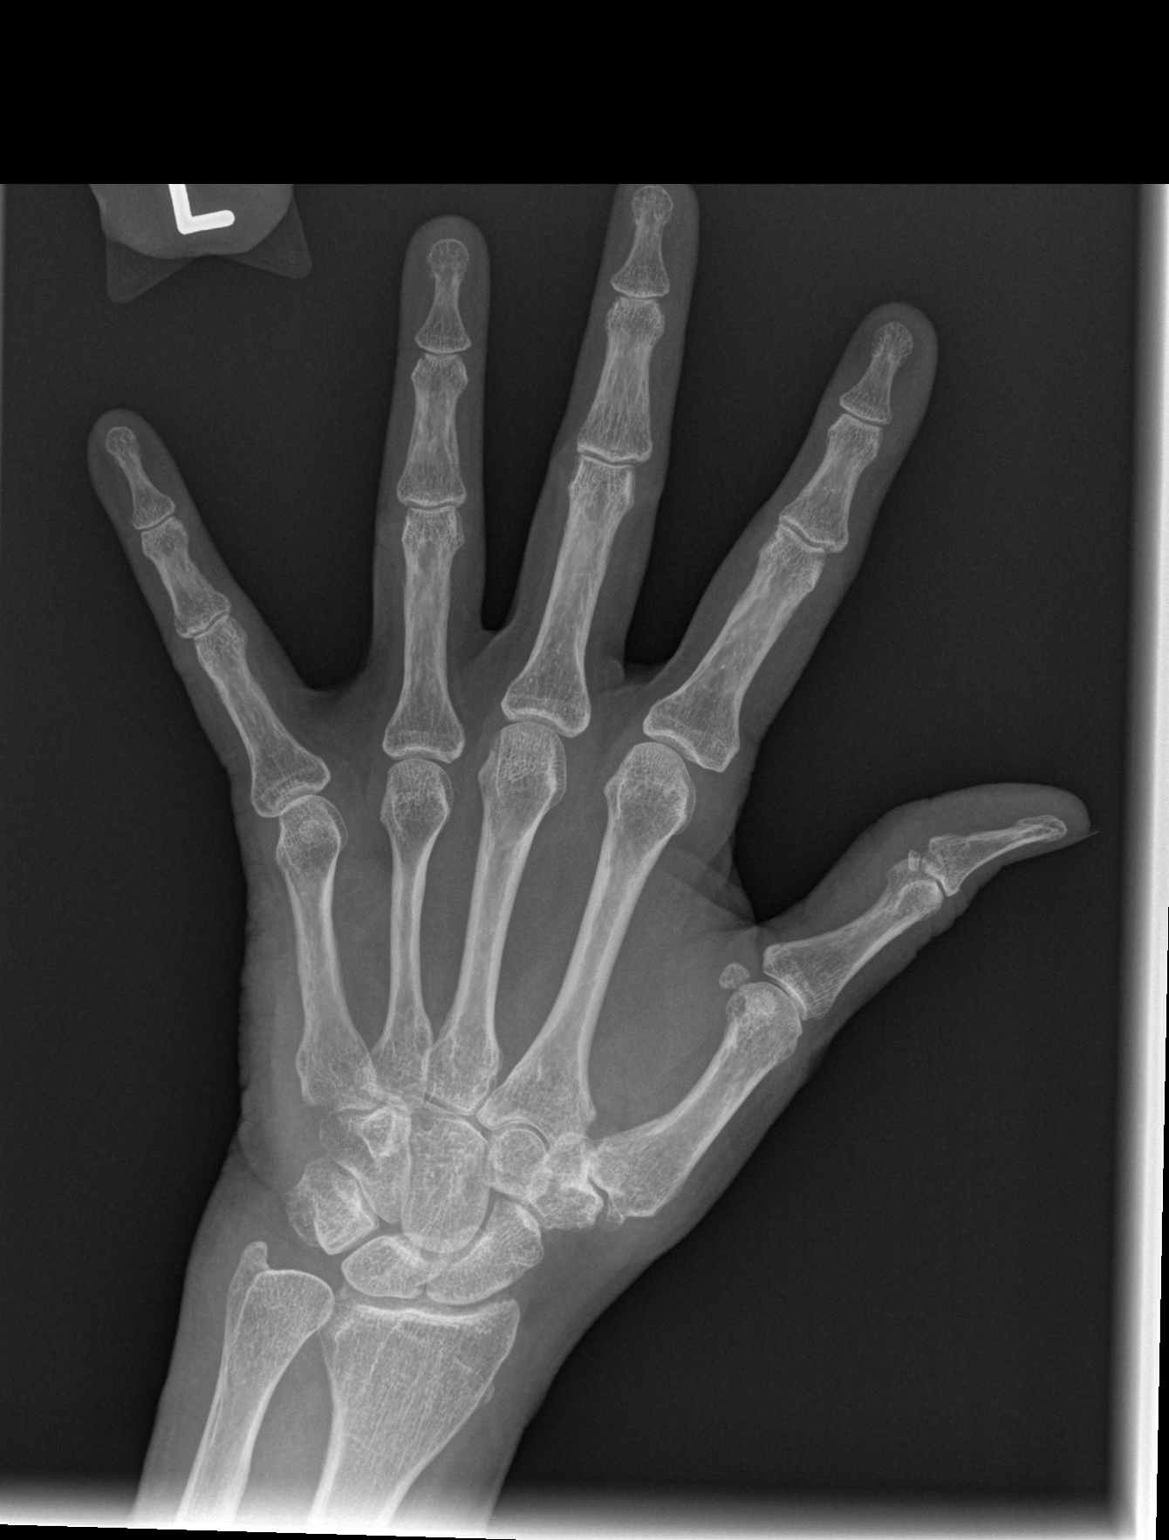

[x hand oblique left]
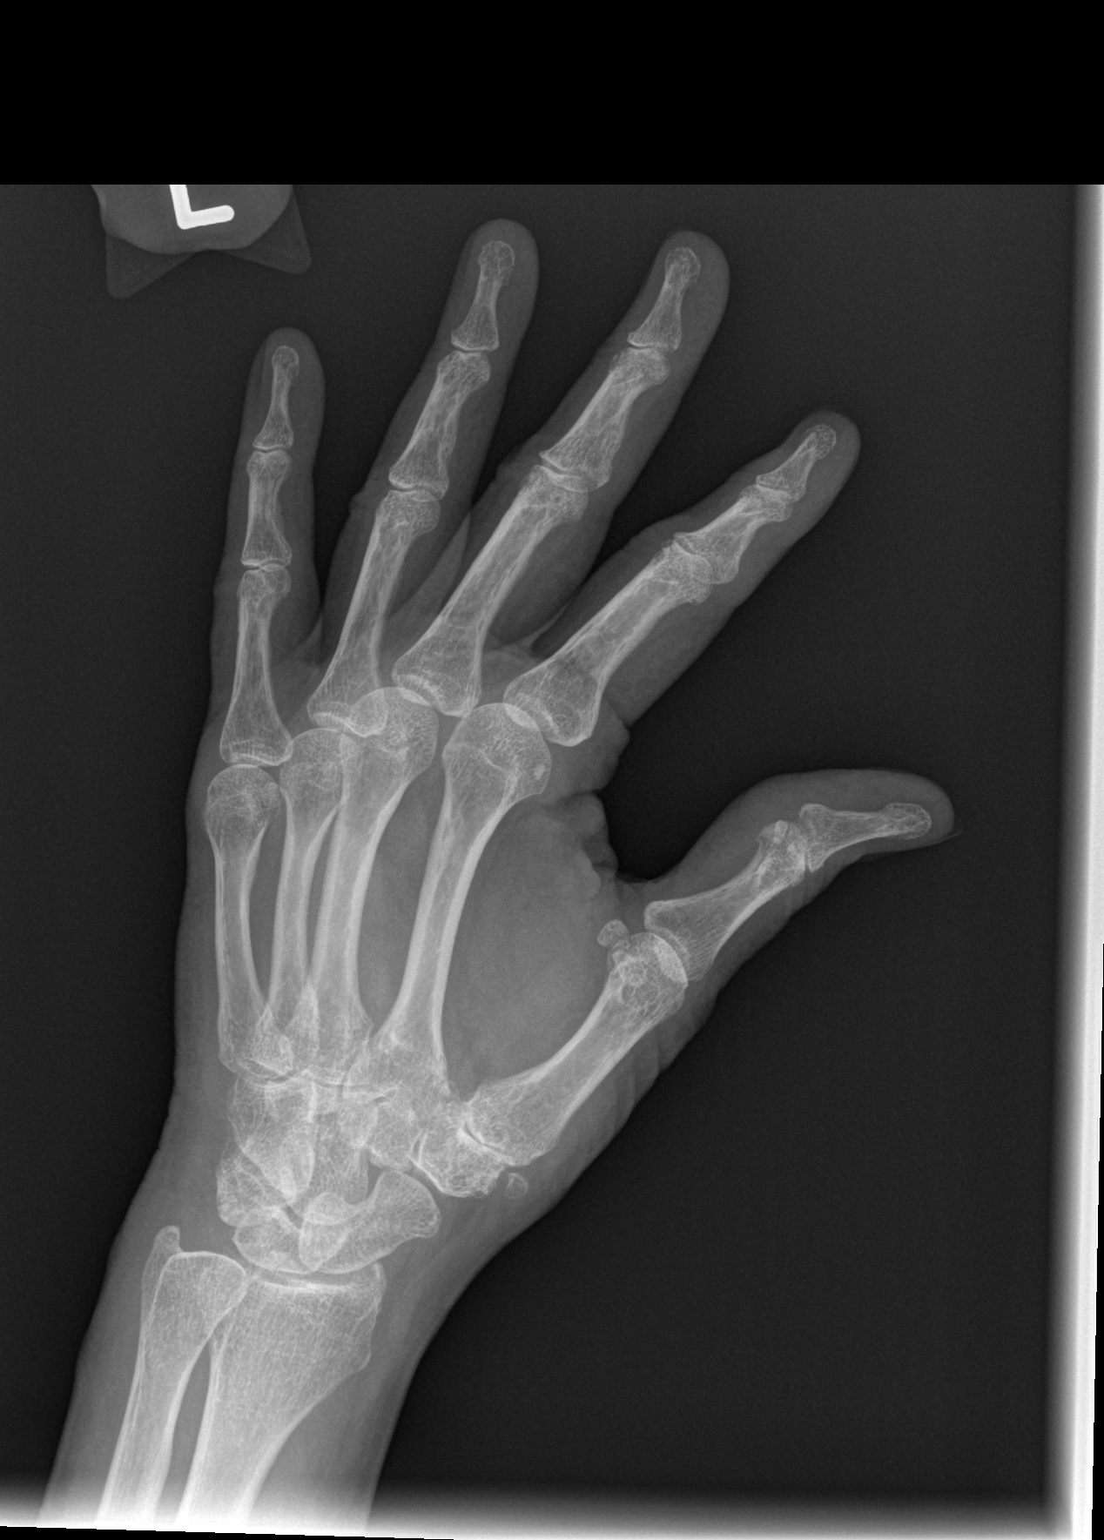

[x hand lat left]
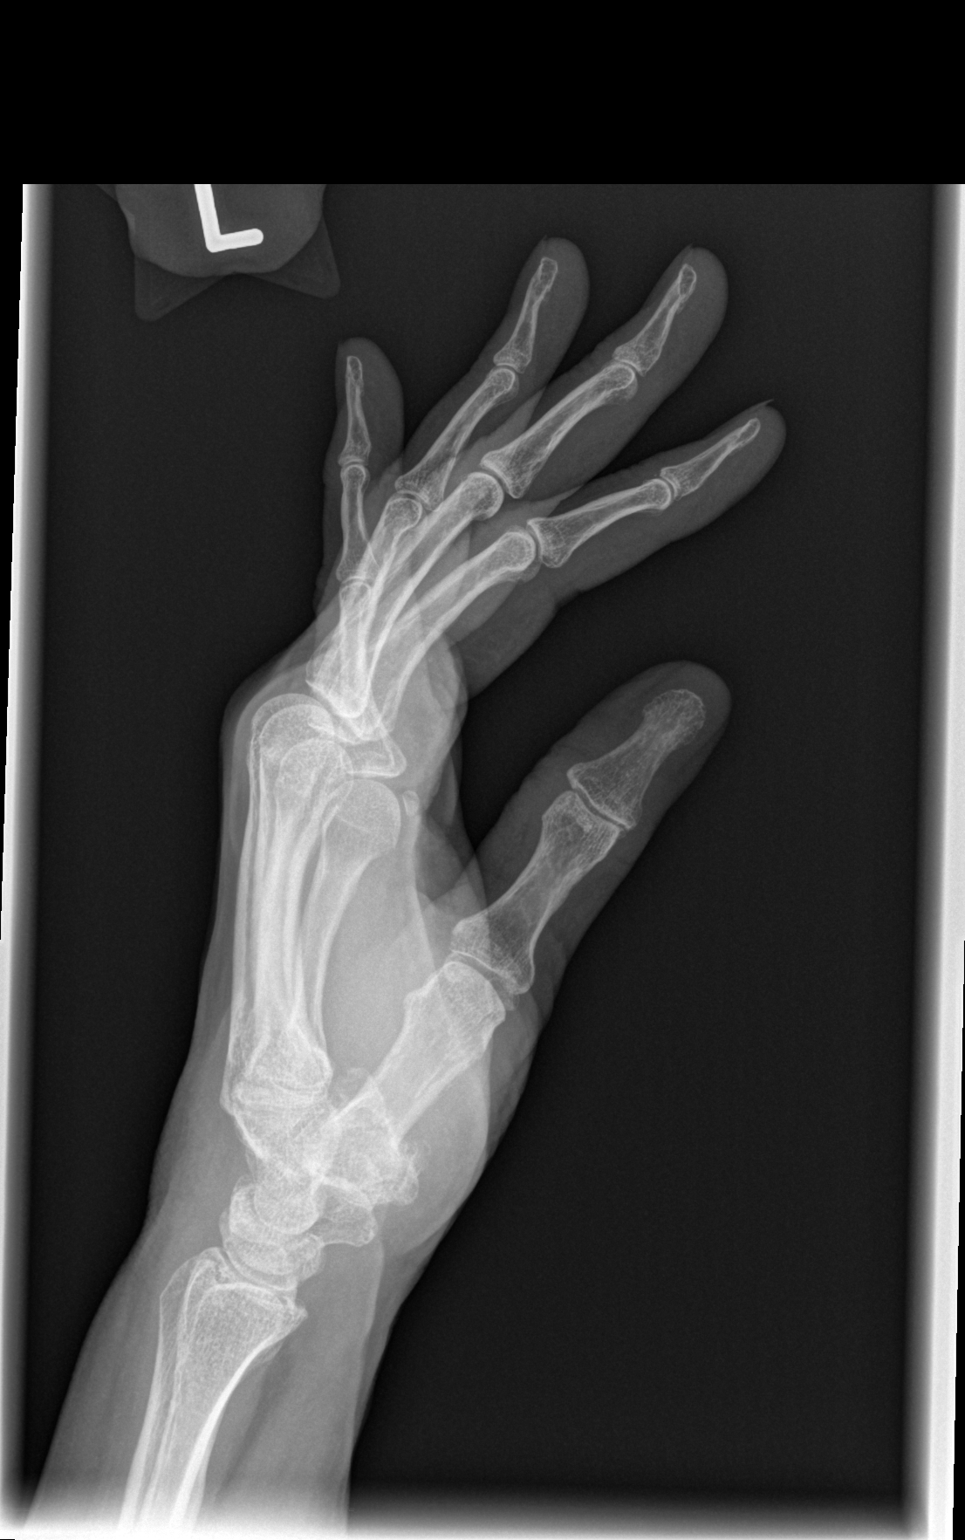

[3 of 3 positions shown; findings below may reference images not displayed]

FINDINGS: The bones are adequately mineralized. There is mild narrowing of the
DIP joint spaces. The metacarpophalangeal joints are reasonably well
maintained. There is moderate degenerative change of the first
carpometacarpal joint. There is no acute or healing fracture. No
erosive changes are demonstrated. The soft tissues are unremarkable.
IMPRESSION: There is moderate osteoarthritic change of the first carpometacarpal
joint. There are mild interphalangeal joint degenerative changes as
well.

## 2015-11-03 IMAGING — CR DG HAND COMPLETE 3+V*R*
3 series · 3 of 3 positions shown · non-contrast
Comparison: None.

CLINICAL DATA: Bilateral hand pain in the palm lower region with
fingers pain and stiffness; history of bilateral carpal tunnel
surgery; no history of hand trauma

EXAM:
RIGHT HAND - COMPLETE 3+ VIEW

[x hand pa right]
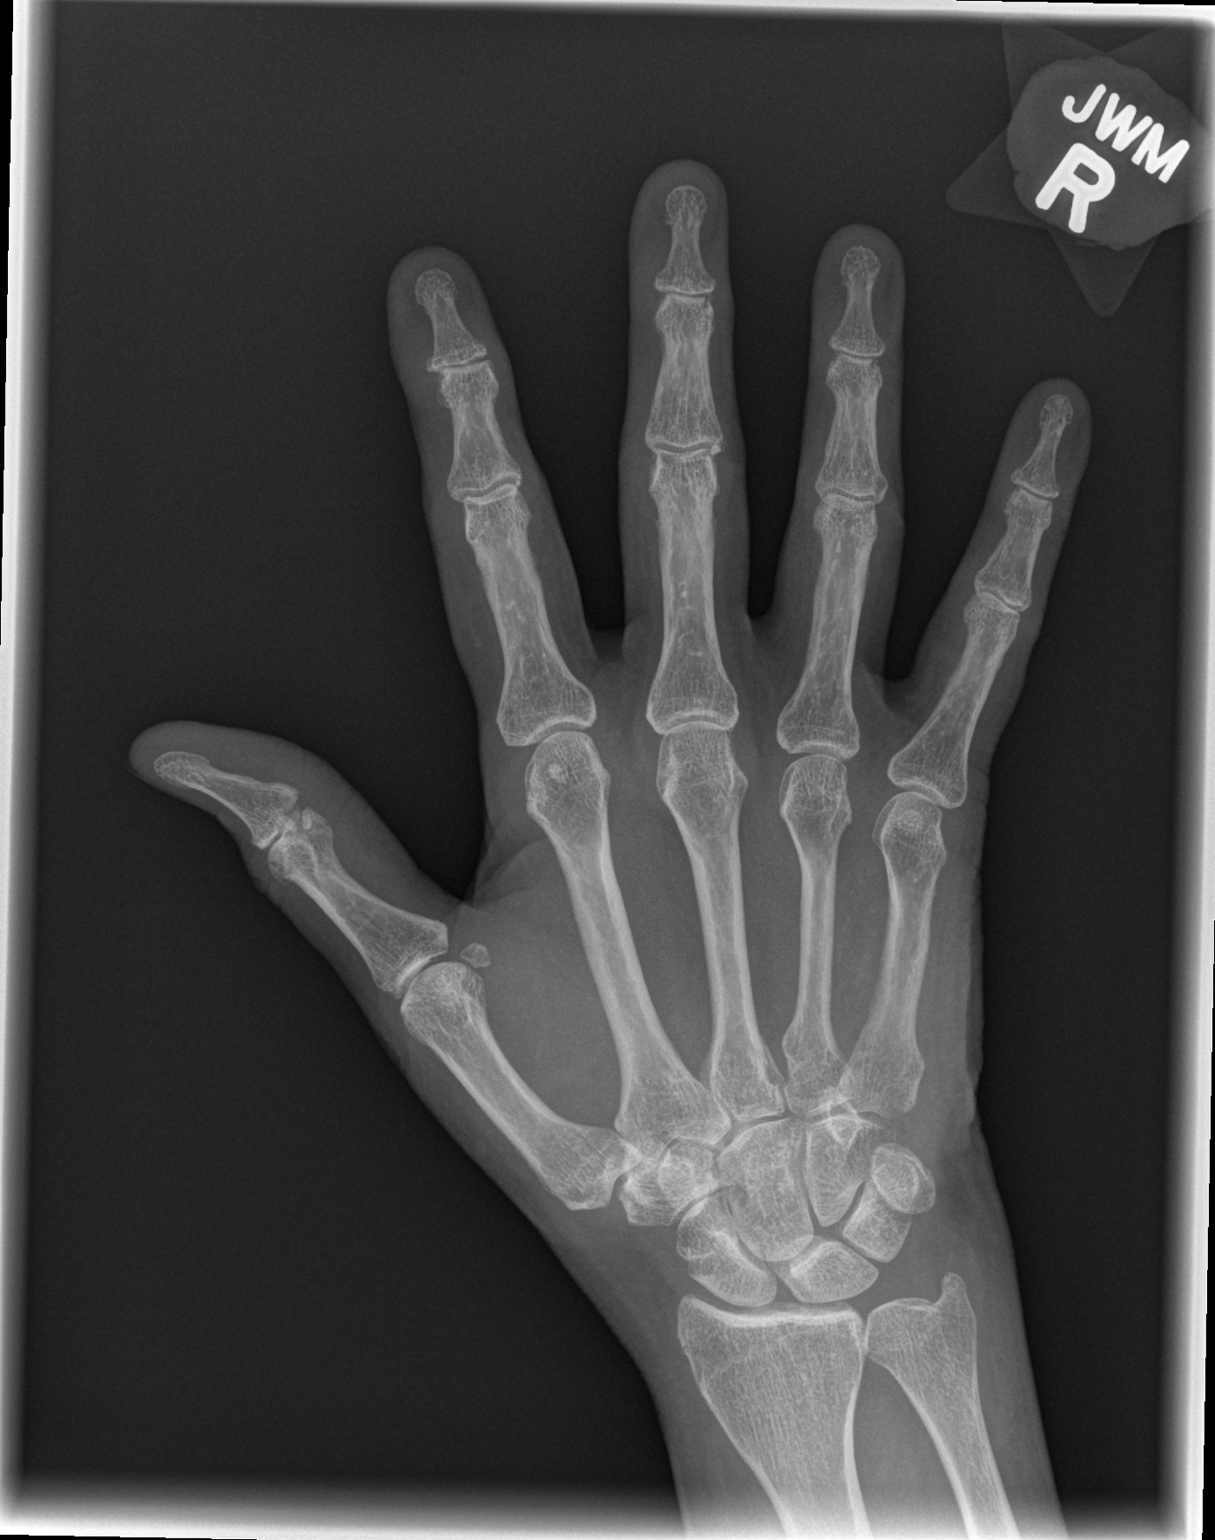

[x hand oblique right]
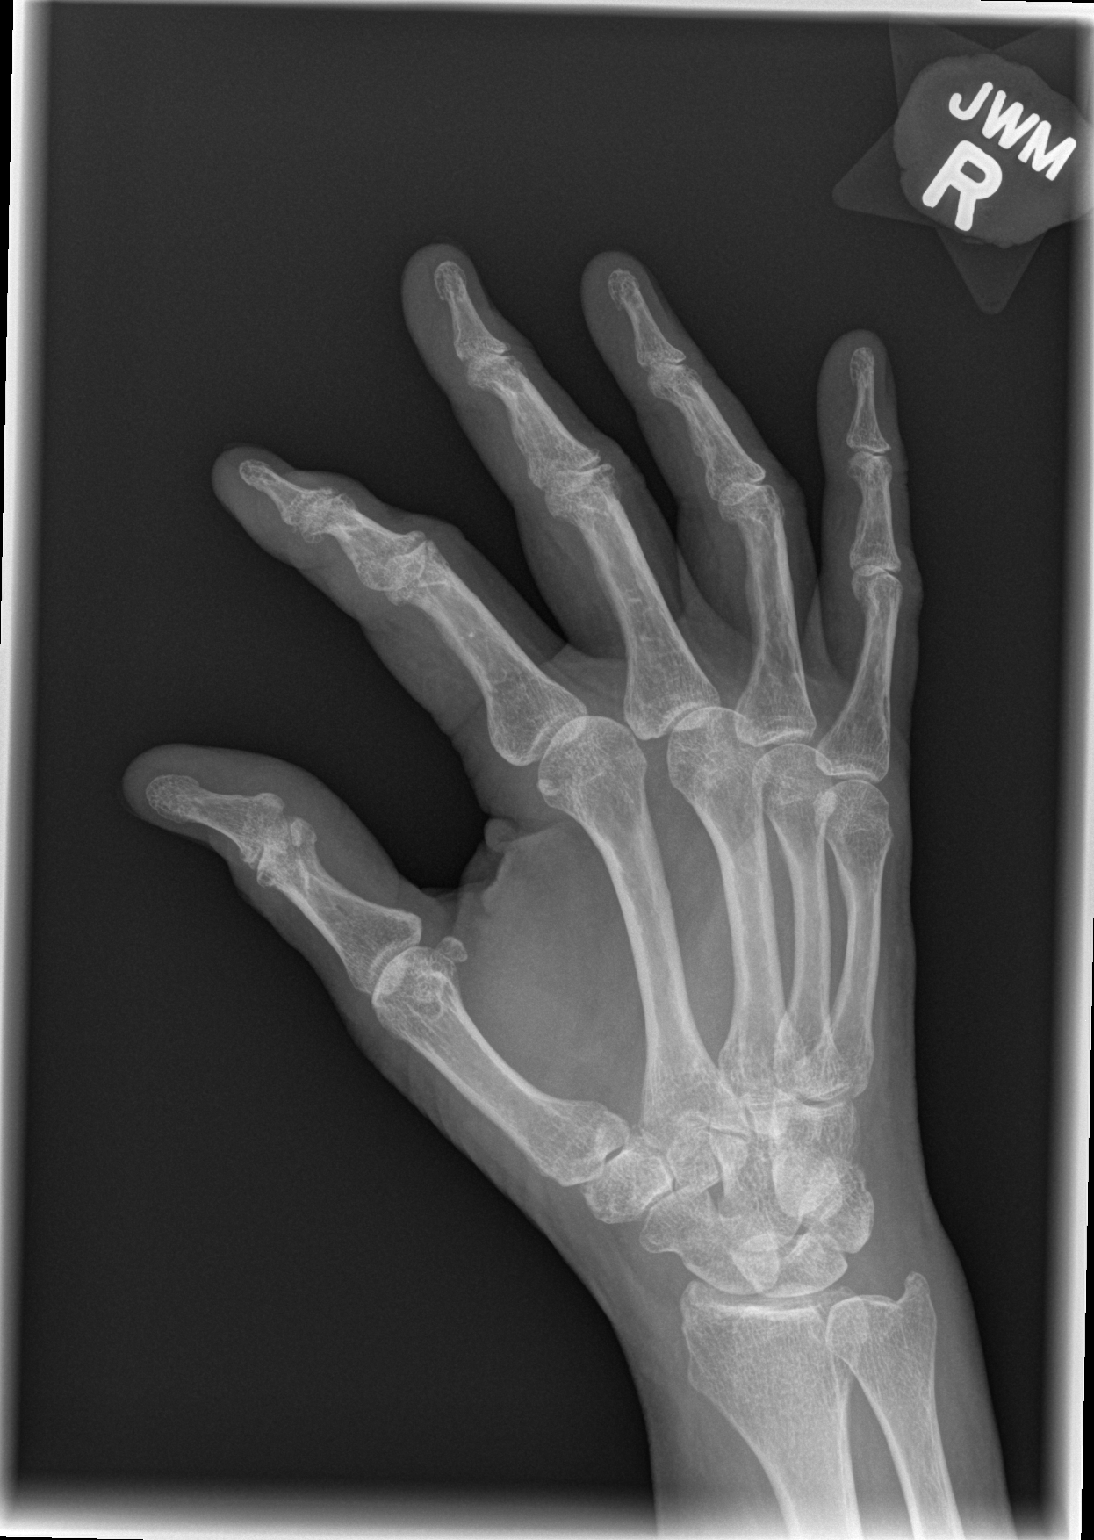

[x hand lat right]
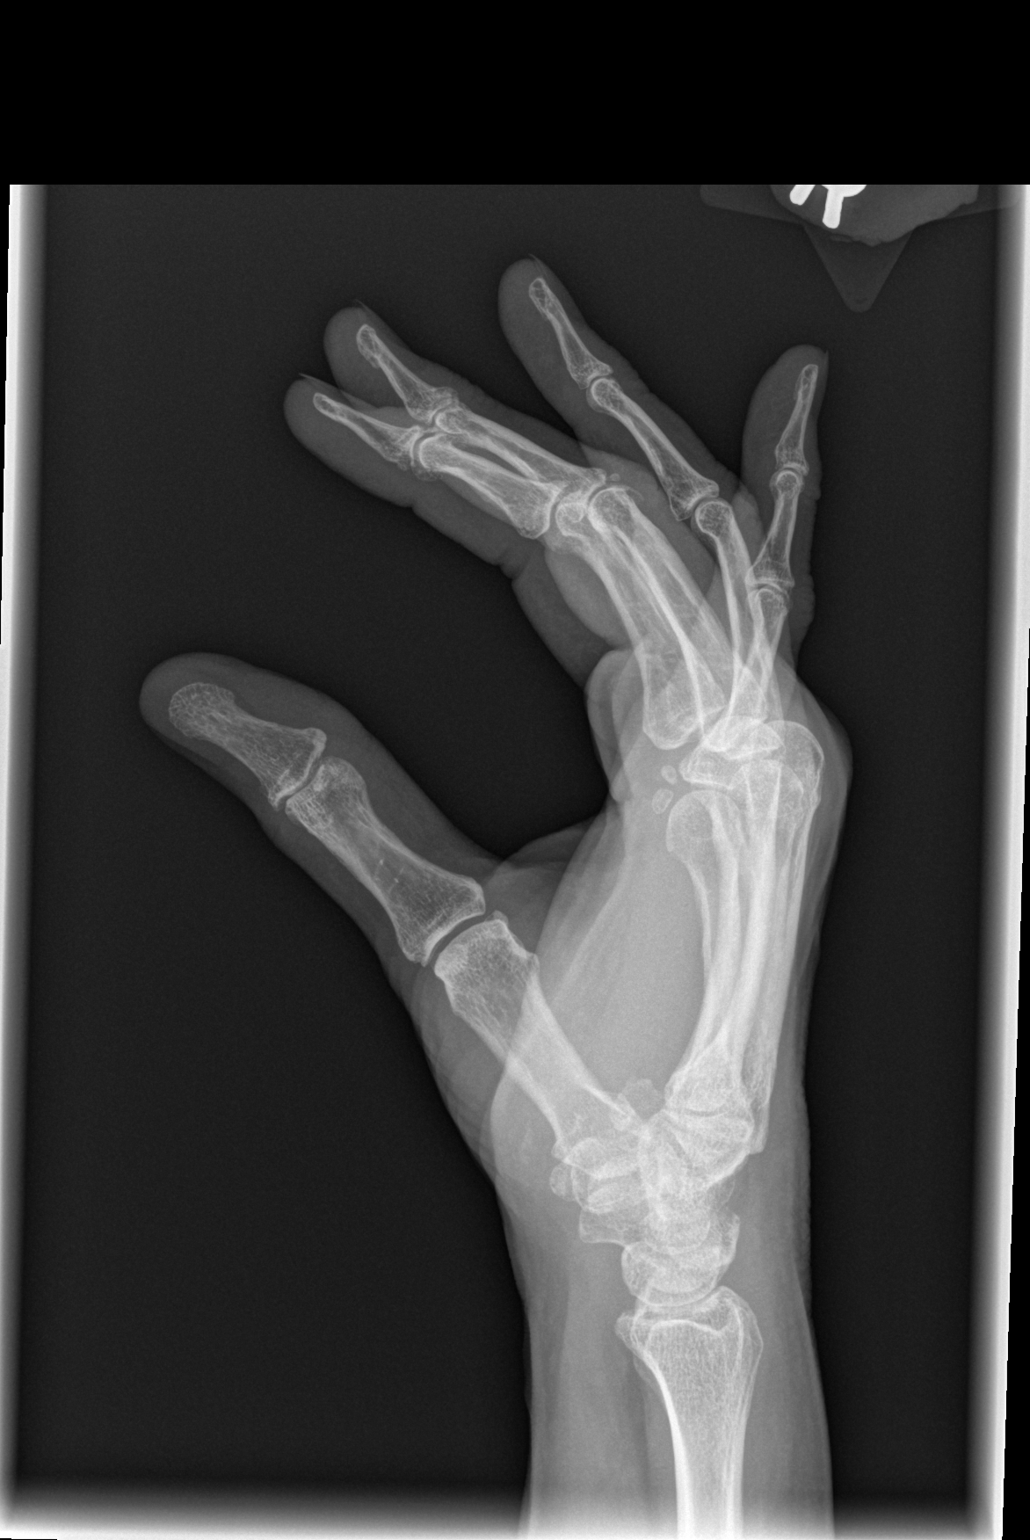

[3 of 3 positions shown; findings below may reference images not displayed]

FINDINGS: The bones of the right hand are mildly osteopenic. There is no acute
or healing fracture. There is mild narrowing of the DIP joints.
There is mild narrowing of the third metacarpophalangeal joint.
There are mild degenerative changes of the first carpometacarpal
joint. The soft tissues are unremarkable.
IMPRESSION: There mild osteoarthritic changes of the right hand as described.
There is no acute bony abnormality.

## 2022-03-01 ENCOUNTER — Encounter: Payer: Self-pay | Admitting: Internal Medicine
